# Patient Record
Sex: Male | Born: 1970 | Race: White | Hispanic: No | Marital: Married | Smoking: Current every day smoker
Health system: Southern US, Community
[De-identification: ages and names within clinical notes are randomized; demographics above are authoritative.]

## PROBLEM LIST (undated history)

## (undated) DIAGNOSIS — K449 Diaphragmatic hernia without obstruction or gangrene: Secondary | ICD-10-CM

## (undated) DIAGNOSIS — I10 Essential (primary) hypertension: Secondary | ICD-10-CM

## (undated) HISTORY — PX: LEG SURGERY: SHX1003

## (undated) HISTORY — DX: Essential (primary) hypertension: I10

## (undated) HISTORY — DX: Diaphragmatic hernia without obstruction or gangrene: K44.9

---

## 2010-01-07 ENCOUNTER — Emergency Department (HOSPITAL_COMMUNITY): Admission: EM | Admit: 2010-01-07 | Discharge: 2010-01-07 | Payer: Self-pay | Admitting: Emergency Medicine

## 2010-01-27 ENCOUNTER — Emergency Department (HOSPITAL_COMMUNITY): Admission: EM | Admit: 2010-01-27 | Discharge: 2010-01-27 | Payer: Self-pay | Admitting: Emergency Medicine

## 2010-03-22 ENCOUNTER — Emergency Department (HOSPITAL_COMMUNITY): Admission: EM | Admit: 2010-03-22 | Discharge: 2010-03-22 | Payer: Self-pay | Admitting: Emergency Medicine

## 2011-01-05 ENCOUNTER — Emergency Department (HOSPITAL_COMMUNITY)
Admission: EM | Admit: 2011-01-05 | Discharge: 2011-01-05 | Payer: Self-pay | Source: Home / Self Care | Admitting: Emergency Medicine

## 2011-01-24 ENCOUNTER — Emergency Department (HOSPITAL_COMMUNITY)
Admission: EM | Admit: 2011-01-24 | Discharge: 2011-01-24 | Disposition: A | Discharge status: Home / Self Care | Payer: Self-pay | Attending: Emergency Medicine | Admitting: Emergency Medicine

## 2011-01-24 DIAGNOSIS — K029 Dental caries, unspecified: Secondary | ICD-10-CM | POA: Insufficient documentation

## 2011-01-24 DIAGNOSIS — K089 Disorder of teeth and supporting structures, unspecified: Secondary | ICD-10-CM | POA: Insufficient documentation

## 2011-02-28 ENCOUNTER — Emergency Department (HOSPITAL_COMMUNITY)
Admission: EM | Admit: 2011-02-28 | Discharge: 2011-02-28 | Disposition: A | Discharge status: Home / Self Care | Payer: Self-pay | Attending: Emergency Medicine | Admitting: Emergency Medicine

## 2011-02-28 DIAGNOSIS — K029 Dental caries, unspecified: Secondary | ICD-10-CM | POA: Insufficient documentation

## 2011-02-28 DIAGNOSIS — K089 Disorder of teeth and supporting structures, unspecified: Secondary | ICD-10-CM | POA: Insufficient documentation

## 2011-03-05 ENCOUNTER — Emergency Department (HOSPITAL_COMMUNITY)
Admission: EM | Admit: 2011-03-05 | Discharge: 2011-03-05 | Disposition: A | Discharge status: Home / Self Care | Payer: Self-pay | Attending: Emergency Medicine | Admitting: Emergency Medicine

## 2011-03-05 DIAGNOSIS — K089 Disorder of teeth and supporting structures, unspecified: Secondary | ICD-10-CM | POA: Insufficient documentation

## 2011-03-05 DIAGNOSIS — K029 Dental caries, unspecified: Secondary | ICD-10-CM | POA: Insufficient documentation

## 2011-03-29 ENCOUNTER — Emergency Department (HOSPITAL_COMMUNITY)
Admission: EM | Admit: 2011-03-29 | Discharge: 2011-03-29 | Discharge status: Against Medical Advice | Payer: Self-pay | Attending: Emergency Medicine | Admitting: Emergency Medicine

## 2011-03-29 DIAGNOSIS — M549 Dorsalgia, unspecified: Secondary | ICD-10-CM | POA: Insufficient documentation

## 2012-05-20 ENCOUNTER — Encounter (HOSPITAL_COMMUNITY): Payer: Self-pay | Admitting: Emergency Medicine

## 2012-05-20 ENCOUNTER — Emergency Department (HOSPITAL_COMMUNITY)
Admission: EM | Admit: 2012-05-20 | Discharge: 2012-05-20 | Disposition: A | Discharge status: Home / Self Care | Payer: Self-pay | Attending: Emergency Medicine | Admitting: Emergency Medicine

## 2012-05-20 DIAGNOSIS — F17200 Nicotine dependence, unspecified, uncomplicated: Secondary | ICD-10-CM | POA: Insufficient documentation

## 2012-05-20 DIAGNOSIS — K089 Disorder of teeth and supporting structures, unspecified: Secondary | ICD-10-CM | POA: Insufficient documentation

## 2012-05-20 DIAGNOSIS — K0889 Other specified disorders of teeth and supporting structures: Secondary | ICD-10-CM

## 2012-05-20 MED ORDER — HYDROCODONE-ACETAMINOPHEN 7.5-500 MG/15ML PO SOLN: 15.0000 mL | Freq: Four times a day (QID) | ORAL | Status: AC | PRN

## 2012-05-20 NOTE — Discharge Instructions (Signed)
Donald Haynes you have been here several times over the past 2 years for the same problem.  Call the dentist today or Monday and get in to see him.  He will work with you for payment.  We can tkeep giving you narcotic pain meds for a problem that is not getting fixed.  Take the but do not drive with this medication.  Affordable dentures also pulls teeth for about 70.00.  I do not see any infection.    Dental Pain A tooth ache may be caused by cavities (tooth decay). Cavities expose the nerve of the tooth to air and hot or cold temperatures. It may come from an infection or abscess (also called a boil or furuncle) around your tooth. It is also often caused by dental caries (tooth decay). This causes the pain you are having. DIAGNOSIS  Your caregiver can diagnose this problem by exam. TREATMENT   If caused by an infection, it may be treated with medications which kill germs (antibiotics) and pain medications as prescribed by your caregiver. Take medications as directed.   Only take over-the-counter or prescription medicines for pain, discomfort, or fever as directed by your caregiver.   Whether the tooth ache today is caused by infection or dental disease, you should see your dentist as soon as possible for further care.  SEEK MEDICAL CARE IF: The exam and treatment you received today has been provided on an emergency basis only. This is not a substitute for complete medical or dental care. If your problem worsens or new problems (symptoms) appear, and you are unable to meet with your dentist, call or return to this location. SEEK IMMEDIATE MEDICAL CARE IF:   You have a fever.   You develop redness and swelling of your face, jaw, or neck.   You are unable to open your mouth.   You have severe pain uncontrolled by pain medicine.  MAKE SURE YOU:   Understand these instructions.   Will watch your condition.   Will get help right away if you are not doing well or get worse.  Document Released:  11/29/2005 Document Revised: 11/18/2011 Document Reviewed: 07/17/2008 [facility] Patient Information 2012 [place], [place].

## 2012-05-20 NOTE — ED Notes (Signed)
Pt. Stated, My little boy came back on me and broke my front tooth which was atready bad.

## 2012-05-20 NOTE — ED Provider Notes (Signed)
History     CSN: [id number]  Arrival date & time 05/20/12  4098   First MD Initiated Contact with Patient 05/20/12 [phone]      Chief Complaint  Patient presents with  . Dental Pain    (Consider location/radiation/quality/duration/timing/severity/associated sxs/prior treatment) Patient is a 41 y.o. male presenting with tooth pain. The history is provided by the patient. No language interpreter was used.  Dental PainThe primary symptoms include mouth pain. Primary symptoms do not include fever or sore throat. The symptoms are worsening. The symptoms occur constantly.  Additional symptoms include: dental sensitivity to temperature and gum tenderness. Additional symptoms do not include: gum swelling, purulent gums and ear pain. Medical issues include: smoking.  Patient her for tooth pain that he has had for 4 years according to records.  States that his son leaned back and cracked his front tooth.  Upper teeth with about 10 decayed teeth.  Patient has gotten one tooth pulled in the last 3 years that he has been coming here for pain meds.  No fracture seen in the tooth he is complaining about.  No gum swelling.  States that he used a hot needle this am to drain infection.  No area noted of infection.    History reviewed. No pertinent past medical history.  History reviewed. No pertinent past surgical history.  No family history on file.  History  Substance Use Topics  . Smoking status: Current Everyday Smoker    Types: Cigarettes  . Smokeless tobacco: Not on file  . Alcohol Use: No      Review of Systems  Constitutional: Negative.  Negative for fever.  HENT: Negative.  Negative for ear pain and sore throat.   Eyes: Negative.   Respiratory: Negative.   Cardiovascular: Negative.   Gastrointestinal: Negative.  Negative for nausea and vomiting.  Neurological: Negative.   Psychiatric/Behavioral: Negative.   All other systems reviewed and are negative.    Allergies  Penicillins  and Tramadol  Home Medications   Current Outpatient Rx  Name Route Sig Dispense Refill  . ANBESOL MT Mouth/Throat Use as directed 1 application in the mouth or throat as needed. For tooth pain    . IBUPROFEN 600 MG PO TABS Oral Take 1,200 mg by mouth every 6 (six) hours as needed. For pain      BP 141/72  Pulse 87  Temp(Src) 98.2 F (36.8 C) (Oral)  Resp 20  SpO2 97%  Physical Exam  Nursing note and vitals reviewed. Constitutional: He is oriented to person, place, and time. He appears well-developed and well-nourished.  HENT:  Head: Normocephalic.  Eyes: Conjunctivae and EOM are normal. Pupils are equal, round, and reactive to light.  Neck: Normal range of motion. Neck supple.  Cardiovascular: Normal rate.   Pulmonary/Chest: Effort normal.  Abdominal: Soft.  Musculoskeletal: Normal range of motion.  Neurological: He is alert and oriented to person, place, and time.  Skin: Skin is warm and dry.  Psychiatric: He has a normal mood and affect.    ED Course  Procedures (including critical care time)  Labs Reviewed - No data to display No results found.   No diagnosis found.    MDM  C/o fractured tooth and dental pain.  No fracture seen.  Suspect drug seeker.  Has been in the system 7 times in the past 2 years for pain meds.  Will give lortab elixir rx for pain.  No infection seen.        Remi Haggard, NP  05/21/12 1206  Patient not in room on discharge  Remi Haggard, NP 05/21/12 1209

## 2012-05-21 NOTE — ED Provider Notes (Signed)
Medical screening examination/treatment/procedure(s) were performed by non-physician practitioner and as supervising physician I was immediately available for consultation/collaboration.   Nat Christen, MD 05/21/12 [phone]

## 2014-05-29 ENCOUNTER — Encounter (HOSPITAL_COMMUNITY): Payer: Self-pay | Admitting: Emergency Medicine

## 2014-05-29 ENCOUNTER — Emergency Department (HOSPITAL_COMMUNITY): Payer: Self-pay

## 2014-05-29 ENCOUNTER — Emergency Department (HOSPITAL_COMMUNITY)
Admission: EM | Admit: 2014-05-29 | Discharge: 2014-05-29 | Disposition: A | Discharge status: Home / Self Care | Payer: Self-pay | Attending: Emergency Medicine | Admitting: Emergency Medicine

## 2014-05-29 DIAGNOSIS — S3981XA Other specified injuries of abdomen, initial encounter: Secondary | ICD-10-CM | POA: Insufficient documentation

## 2014-05-29 DIAGNOSIS — Y9289 Other specified places as the place of occurrence of the external cause: Secondary | ICD-10-CM | POA: Insufficient documentation

## 2014-05-29 DIAGNOSIS — S61209A Unspecified open wound of unspecified finger without damage to nail, initial encounter: Secondary | ICD-10-CM | POA: Insufficient documentation

## 2014-05-29 DIAGNOSIS — Y9389 Activity, other specified: Secondary | ICD-10-CM | POA: Insufficient documentation

## 2014-05-29 DIAGNOSIS — S6990XA Unspecified injury of unspecified wrist, hand and finger(s), initial encounter: Secondary | ICD-10-CM

## 2014-05-29 DIAGNOSIS — S0990XA Unspecified injury of head, initial encounter: Secondary | ICD-10-CM

## 2014-05-29 DIAGNOSIS — Z880 Allergy status to penicillin: Secondary | ICD-10-CM | POA: Insufficient documentation

## 2014-05-29 DIAGNOSIS — S39012A Strain of muscle, fascia and tendon of lower back, initial encounter: Secondary | ICD-10-CM

## 2014-05-29 DIAGNOSIS — F1410 Cocaine abuse, uncomplicated: Secondary | ICD-10-CM | POA: Insufficient documentation

## 2014-05-29 DIAGNOSIS — S060X9A Concussion with loss of consciousness of unspecified duration, initial encounter: Secondary | ICD-10-CM | POA: Insufficient documentation

## 2014-05-29 DIAGNOSIS — W19XXXA Unspecified fall, initial encounter: Secondary | ICD-10-CM

## 2014-05-29 DIAGNOSIS — S61219A Laceration without foreign body of unspecified finger without damage to nail, initial encounter: Secondary | ICD-10-CM

## 2014-05-29 DIAGNOSIS — W1809XA Striking against other object with subsequent fall, initial encounter: Secondary | ICD-10-CM | POA: Insufficient documentation

## 2014-05-29 DIAGNOSIS — F17200 Nicotine dependence, unspecified, uncomplicated: Secondary | ICD-10-CM | POA: Insufficient documentation

## 2014-05-29 DIAGNOSIS — S335XXA Sprain of ligaments of lumbar spine, initial encounter: Secondary | ICD-10-CM | POA: Insufficient documentation

## 2014-05-29 LAB — BASIC METABOLIC PANEL
BUN: 14 mg/dL (ref 6–23)
CHLORIDE: 102 meq/L (ref 96–112)
CO2: 26 mEq/L (ref 19–32)
CREATININE: 0.71 mg/dL (ref 0.50–1.35)
Calcium: 9.1 mg/dL (ref 8.4–10.5)
Glucose, Bld: 110 mg/dL — ABNORMAL HIGH (ref 70–99)
POTASSIUM: 3.5 meq/L — AB (ref 3.7–5.3)
Sodium: 141 mEq/L (ref 137–147)

## 2014-05-29 LAB — CBC WITH DIFFERENTIAL/PLATELET
BASOS ABS: 0 10*3/uL (ref 0.0–0.1)
BASOS PCT: 0 % (ref 0–1)
EOS ABS: 0.3 10*3/uL (ref 0.0–0.7)
Eosinophils Relative: 4 % (ref 0–5)
HCT: 34.4 % — ABNORMAL LOW (ref 39.0–52.0)
Hemoglobin: 11.7 g/dL — ABNORMAL LOW (ref 13.0–17.0)
Lymphocytes Relative: 42 % (ref 12–46)
Lymphs Abs: 3.5 10*3/uL (ref 0.7–4.0)
MCH: 29.3 pg (ref 26.0–34.0)
MCHC: 34 g/dL (ref 30.0–36.0)
MCV: 86 fL (ref 78.0–100.0)
MONO ABS: 0.8 10*3/uL (ref 0.1–1.0)
MONOS PCT: 9 % (ref 3–12)
NEUTROS ABS: 3.7 10*3/uL (ref 1.7–7.7)
NEUTROS PCT: 45 % (ref 43–77)
Platelets: 351 10*3/uL (ref 150–400)
RBC: 4 MIL/uL — ABNORMAL LOW (ref 4.22–5.81)
RDW: 14.3 % (ref 11.5–15.5)
WBC: 8.3 10*3/uL (ref 4.0–10.5)

## 2014-05-29 MED ORDER — HYDROMORPHONE HCL PF 1 MG/ML IJ SOLN
1.0000 mg | Freq: Once | INTRAMUSCULAR | Status: AC
  Administered 2014-05-29: 1 mg via INTRAVENOUS
  Filled 2014-05-29: qty 1

## 2014-05-29 MED ORDER — IOHEXOL 300 MG/ML  SOLN
100.0000 mL | Freq: Once | INTRAMUSCULAR | Status: AC | PRN
  Administered 2014-05-29: 100 mL via INTRAVENOUS

## 2014-05-29 MED ORDER — SODIUM CHLORIDE 0.9 % IV BOLUS (SEPSIS)
250.0000 mL | Freq: Once | INTRAVENOUS | Status: AC
  Administered 2014-05-29: 250 mL via INTRAVENOUS

## 2014-05-29 MED ORDER — LIDOCAINE HCL (PF) 2 % IJ SOLN
INTRAMUSCULAR | Status: AC
  Filled 2014-05-29: qty 10

## 2014-05-29 MED ORDER — HYDROCODONE-ACETAMINOPHEN 5-325 MG PO TABS: 2.0000 | ORAL_TABLET | ORAL | Status: DC | PRN

## 2014-05-29 MED ORDER — POVIDONE-IODINE 10 % EX SOLN
CUTANEOUS | Status: AC
  Filled 2014-05-29: qty 118

## 2014-05-29 MED ORDER — SODIUM CHLORIDE 0.9 % IV SOLN
INTRAVENOUS | Status: DC
  Administered 2014-05-29: 1000 mL via INTRAVENOUS

## 2014-05-29 MED ORDER — NAPROXEN 500 MG PO TABS: 500.0000 mg | ORAL_TABLET | Freq: Two times a day (BID) | ORAL | Status: DC

## 2014-05-29 MED ORDER — ONDANSETRON HCL 4 MG/2ML IJ SOLN
4.0000 mg | Freq: Once | INTRAMUSCULAR | Status: AC
  Administered 2014-05-29: 4 mg via INTRAVENOUS
  Filled 2014-05-29: qty 2

## 2014-05-29 MED ORDER — BACITRACIN-NEOMYCIN-POLYMYXIN 400-5-5000 EX OINT
TOPICAL_OINTMENT | CUTANEOUS | Status: AC
  Filled 2014-05-29: qty 1

## 2014-05-29 MED ORDER — HYDROCODONE-ACETAMINOPHEN 5-325 MG PO TABS: 1.0000 | ORAL_TABLET | Freq: Four times a day (QID) | ORAL | Status: DC | PRN

## 2014-05-29 MED ORDER — KETOROLAC TROMETHAMINE 30 MG/ML IJ SOLN
30.0000 mg | Freq: Once | INTRAMUSCULAR | Status: AC
  Administered 2014-05-29: 30 mg via INTRAVENOUS
  Filled 2014-05-29: qty 1

## 2014-05-29 NOTE — ED Provider Notes (Signed)
LACERATION REPAIR Performed by: Burgess AmorIDOL, JULIE Authorized by: Burgess AmorIDOL, JULIE Consent: Verbal consent obtained. Risks and benefits: risks, benefits and alternatives were discussed Consent given by: patient Patient identity confirmed: provided demographic data Prepped and Draped in normal sterile fashion Wound explored  Laceration Location: left index finger  Laceration Length: 2.5 cm x 0.5 cmcm  No Foreign Bodies seen or palpated  Anesthesia: digital block  Local anesthetic: lidocaine2% without epinephrine  Anesthetic total: 1.5 ml  Irrigation method: syringe Amount of cleaning: standard  Skin closure: ethilon 4-0  Number of sutures: 8  Technique: simple interrupted.  Patient tolerance: Patient tolerated the procedure well with no immediate complications.   NERVE BLOCK Performed by: Burgess AmorIDOL, JULIE Consent: Verbal consent obtained. Required items: required blood products, implants, devices, and special equipment available Time out: Immediately prior to procedure a "time out" was called to verify the correct patient, procedure, equipment, support staff and site/side marked as required.  Indication: laceration Nerve block body site: left index finger  Preparation: Patient was prepped and draped in the usual sterile fashion. Needle gauge: 25G Location technique: anatomical landmarks  Local anesthetic: lidocaine 2% without epi  Anesthetic total: 1.5 ml  Outcome: pain improved Patient tolerance: Patient tolerated the procedure well with no immediate complications.   Burgess AmorJulie Idol, PA-C 05/29/14 2306

## 2014-05-29 NOTE — ED Notes (Signed)
Placed hand to soak in order to remove gauze.

## 2014-05-29 NOTE — ED Provider Notes (Addendum)
CSN: [id number]     Arrival date & time 05/29/14  1843 History   First MD Initiated Contact with Patient 05/29/14 1954     Chief Complaint  Patient presents with  . Fall     (Consider location/radiation/quality/duration/timing/severity/associated sxs/prior Treatment) Patient is a 43 y.o. male presenting with fall. The history is provided by the patient.  Fall Associated symptoms include abdominal pain and headaches. Pertinent negatives include no chest pain and no shortness of breath.   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 Patient has no recollection of the fall apparently did have loss of consciousness. Patient with complaint of left index finger laceration and pain in the lumbar area and questionable pain to the abdomen. Patient was not brought in by EMS. Brought in by family members. Patient is complaining of headache is alert and oriented. Denies any neck pain. Patient not in a c-collar on spineboard. Patient states his tetanus is up-to-date.      History reviewed. No pertinent past medical history. History reviewed. No pertinent past surgical history. No family history on file. History  Substance Use Topics  . Smoking status: Current Every Day Smoker    Types: Cigarettes  . Smokeless tobacco: Not on file  . Alcohol Use: No    Review of Systems  Constitutional: Negative for fever.  HENT: Negative for congestion.   Eyes: Negative for visual disturbance.  Respiratory: Negative for shortness of breath.   Cardiovascular: Negative for chest pain.  Gastrointestinal: Positive for abdominal pain. Negative for nausea and vomiting.  Genitourinary: Negative for hematuria.  Musculoskeletal: Positive for back pain. Negative for neck pain.  Skin: Positive for wound.  Neurological: Positive for headaches. Negative for dizziness, speech difficulty, weakness and numbness.  Hematological: Does not bruise/bleed easily.  Psychiatric/Behavioral:  Negative for confusion.      Allergies  Penicillins and Tramadol  Home Medications   Prior to Admission medications   Medication Sig Start Date End Date Taking? Authorizing Provider  ibuprofen (ADVIL,MOTRIN) 600 MG tablet Take 1,200 mg by mouth every 6 (six) hours as needed. For pain   Yes Historical Provider, MD   BP 145/89  Pulse 106  Temp(Src) 97.9 F (36.6 C) (Oral)  Resp 20  Ht 6' (1.829 m)  Wt 170 lb (77.111 kg)  BMI 23.05 kg/m2  SpO2 97% Physical Exam  Nursing note and vitals reviewed. Constitutional: He is oriented to person, place, and time. He appears well-developed and well-nourished. No distress.  HENT:  Head: Normocephalic and atraumatic.  Mouth/Throat: Oropharynx is clear and moist.  Eyes: Conjunctivae and EOM are normal. Pupils are equal, round, and reactive to light.  Neck: Normal range of motion. Neck supple.  Cardiovascular: Normal rate, regular rhythm and normal heart sounds.   No murmur heard. Pulmonary/Chest: Effort normal and breath sounds normal. No respiratory distress.  Abdominal: Soft. Bowel sounds are normal. There is no tenderness.  Musculoskeletal: Normal range of motion.  With the exception of left index finger. Laceration to that area. Index finger distal cap refill is normal. radial pulse in the left hand is 2+. Questionable decreased sensation to the left index finger. No other fingers injured. Patient is right-hand dominant. Laceration to the left index finger saw the palmar surface. Measures about 2.5 cm x 0.5 cm. Morbid avulsion of the skin will require suturing. Does not involve deep structures. No tendon involvement.  Neurological: He is alert and oriented to person, place, and time. No cranial nerve deficit. He exhibits  normal muscle tone. Coordination normal.  Skin: Skin is warm.    ED Course  Procedures (including critical care time) Labs Review Labs Reviewed  CBC WITH DIFFERENTIAL - Abnormal; Notable for the following:    RBC  4.00 (*)    Hemoglobin 11.7 (*)    HCT 34.4 (*)    All other components within normal limits  BASIC METABOLIC PANEL - Abnormal; Notable for the following:    Potassium 3.5 (*)    Glucose, Bld 110 (*)    All other components within normal limits   Results for orders placed during the hospital encounter of 05/29/14  CBC WITH DIFFERENTIAL      Result Value Ref Range   WBC 8.3  4.0 - 10.5 K/uL   RBC 4.00 (*) 4.22 - 5.81 MIL/uL   Hemoglobin 11.7 (*) 13.0 - 17.0 g/dL   HCT 40.9 (*) 81.1 - 91.4 %   MCV 86.0  78.0 - 100.0 fL   MCH 29.3  26.0 - 34.0 pg   MCHC 34.0  30.0 - 36.0 g/dL   RDW 78.2  95.6 - 21.3 %   Platelets 351  150 - 400 K/uL   Neutrophils Relative % 45  43 - 77 %   Neutro Abs 3.7  1.7 - 7.7 K/uL   Lymphocytes Relative 42  12 - 46 %   Lymphs Abs 3.5  0.7 - 4.0 K/uL   Monocytes Relative 9  3 - 12 %   Monocytes Absolute 0.8  0.1 - 1.0 K/uL   Eosinophils Relative 4  0 - 5 %   Eosinophils Absolute 0.3  0.0 - 0.7 K/uL   Basophils Relative 0  0 - 1 %   Basophils Absolute 0.0  0.0 - 0.1 K/uL  BASIC METABOLIC PANEL      Result Value Ref Range   Sodium 141  137 - 147 mEq/L   Potassium 3.5 (*) 3.7 - 5.3 mEq/L   Chloride 102  96 - 112 mEq/L   CO2 26  19 - 32 mEq/L   Glucose, Bld 110 (*) 70 - 99 mg/dL   BUN 14  6 - 23 mg/dL   Creatinine, Ser 0.86  0.50 - 1.35 mg/dL   Calcium 9.1  8.4 - 57.8 mg/dL   GFR calc non Af Amer >90  >90 mL/min   GFR calc Af Amer >90  >90 mL/min     Imaging Review Ct Head Wo Contrast  05/29/2014   CLINICAL DATA:  Patient fell one story through floor. Headache. Concern for cervical spine injury.  EXAM: CT HEAD WITHOUT CONTRAST  CT CERVICAL SPINE WITHOUT CONTRAST  TECHNIQUE: Multidetector CT imaging of the head and cervical spine was performed following the standard protocol without intravenous contrast. Multiplanar CT image reconstructions of the cervical spine were also generated.  COMPARISON:  None.  FINDINGS: CT HEAD FINDINGS  There is no evidence of  acute infarction, mass lesion, or intra- or extra-axial hemorrhage on CT.  The posterior fossa, including the cerebellum, brainstem and fourth ventricle, is within normal limits. The third and lateral ventricles, and basal ganglia are unremarkable in appearance. The cerebral hemispheres are symmetric in appearance, with normal gray-white differentiation. No mass effect or midline shift is seen.  There is no evidence of fracture; visualized osseous structures are unremarkable in appearance. The visualized portions of the orbits are within normal limits. The paranasal sinuses and mastoid air cells are well-aerated. No significant soft tissue abnormalities are seen.  CT CERVICAL SPINE FINDINGS  There is no evidence of fracture or subluxation. Vertebral bodies demonstrate normal height and alignment. Intervertebral disc spaces are preserved. Prevertebral soft tissues are within normal limits. The visualized neural foramina are grossly unremarkable.  The thyroid gland is unremarkable in appearance. Minimal scarring is noted at the lung apices. No significant soft tissue abnormalities are seen.  IMPRESSION: 1. No evidence of traumatic intracranial injury or fracture. 2. No evidence of fracture or subluxation along the cervical spine.   Electronically Signed   By: Roanna RaiderJeffery  Chang M.D.   On: 05/29/2014 21:20   Ct Cervical Spine Wo Contrast  05/29/2014   CLINICAL DATA:  Patient fell one story through floor. Headache. Concern for cervical spine injury.  EXAM: CT HEAD WITHOUT CONTRAST  CT CERVICAL SPINE WITHOUT CONTRAST  TECHNIQUE: Multidetector CT imaging of the head and cervical spine was performed following the standard protocol without intravenous contrast. Multiplanar CT image reconstructions of the cervical spine were also generated.  COMPARISON:  None.  FINDINGS: CT HEAD FINDINGS  There is no evidence of acute infarction, mass lesion, or intra- or extra-axial hemorrhage on CT.  The posterior fossa, including the  cerebellum, brainstem and fourth ventricle, is within normal limits. The third and lateral ventricles, and basal ganglia are unremarkable in appearance. The cerebral hemispheres are symmetric in appearance, with normal gray-white differentiation. No mass effect or midline shift is seen.  There is no evidence of fracture; visualized osseous structures are unremarkable in appearance. The visualized portions of the orbits are within normal limits. The paranasal sinuses and mastoid air cells are well-aerated. No significant soft tissue abnormalities are seen.  CT CERVICAL SPINE FINDINGS  There is no evidence of fracture or subluxation. Vertebral bodies demonstrate normal height and alignment. Intervertebral disc spaces are preserved. Prevertebral soft tissues are within normal limits. The visualized neural foramina are grossly unremarkable.  The thyroid gland is unremarkable in appearance. Minimal scarring is noted at the lung apices. No significant soft tissue abnormalities are seen.  IMPRESSION: 1. No evidence of traumatic intracranial injury or fracture. 2. No evidence of fracture or subluxation along the cervical spine.   Electronically Signed   By: Roanna RaiderJeffery  Chang M.D.   On: 05/29/2014 21:20   Ct Abdomen Pelvis W Contrast  05/29/2014   CLINICAL DATA:  Status post fall with left flank pain  EXAM: CT ABDOMEN AND PELVIS WITH CONTRAST  TECHNIQUE: Multidetector CT imaging of the abdomen and pelvis was performed using the standard protocol following bolus administration of intravenous contrast.  CONTRAST:  100mL OMNIPAQUE IOHEXOL 300 MG/ML  SOLN  COMPARISON:  None.  FINDINGS: There is focal fatty infiltration of liver near the falciform ligament. The liver is otherwise normal. The spleen, pancreas, gallbladder, adrenal glands and kidneys are normal. The aorta is normal in caliber. There is mild atherosclerosis of the right iliac artery. There is no abdominal lymphadenopathy. There is no free air. There is no small  bowel obstruction or diverticulitis. The appendix is not seen but no inflammation is noted around the cecum to suggest appendicitis.  Fluid-filled bladder is normal. There is no pelvic lymphadenopathy. There is minimal dependent atelectasis of the posterior left lung base. There is no pleural effusion. No acute abnormalities identified within the visualized bones.  IMPRESSION: No acute posttraumatic change identified in the abdomen and pelvis. The kidneys are normal without hydronephrosis bilaterally.   Electronically Signed   By: Sherian ReinWei-Chen  Lin M.D.   On: 05/29/2014 21:27   Dg Hand Complete Left  05/29/2014  CLINICAL DATA:  Status post fall with laceration to the left index finger.  EXAM: LEFT HAND - COMPLETE 3+ VIEW  COMPARISON:  None.  FINDINGS: There is no evidence of fracture or dislocation. There is soft tissue swelling with the gauze in the second digit.  IMPRESSION: No acute fracture or dislocation.Soft tissue swelling with the gauze in the second digit.   Electronically Signed   By: Sherian Rein M.D.   On: 05/29/2014 21:40     EKG Interpretation None      MDM   Final diagnoses:  Fall  Head injury  Lumbar strain  Finger injury     patient status post fall at construction site. With loss of consciousness. Injury to left index finger. Patient with right hand dominance. CT scan of head neck abdomen pelvis negative. X-rays of the left hand negative for any bony fractures. Patient's tetanus is up-to-date. Patient clearly has a concussion however is alert 9 and. No nausea no vomiting. Patient can be discharged home. Patient left index finger laceration it is superficial not involving any deep tendon structures. That's more of an avulsion measuring about the 3.5 cm in length. Well require suturing. We'll have him mid-level do the suturing. Patient then can be discharged. Discharge up a work will be completed.   Vanetta Mulders, MD 05/29/14 [id number]  Vanetta Mulders, MD 05/29/14 2207

## 2014-05-29 NOTE — Discharge Instructions (Signed)
Expect to be sore the next 2 days and then slowly improving. He had a significant fall. Take the Naprosyn and pain medicine as needed. Also probably had a concussion so would avoid being on high places for the next 7 days. Work note will be provided for one week. For the finger laceration suture removal bear and 7-10 days. Return for any evidence of wound infection. As you stated you up-to-date on her tetanus.

## 2014-05-29 NOTE — ED Notes (Signed)
Pt reports falling x 2 stories today, hitting head with + LOC.  C/o pain to left flank and right index finger with laceration.  C/o headache.  Alert and oriented x 4.  Denies neck pain.

## 2014-05-29 NOTE — ED Notes (Signed)
Pt alert & oriented x4, stable gait. Patient given discharge instructions, paperwork & prescription(s). Patient  instructed to stop at the registration desk to finish any additional paperwork. Patient verbalized understanding. Pt left department w/ no further questions. 

## 2014-05-30 NOTE — ED Provider Notes (Signed)
Medical screening examination/treatment/procedure(s) were conducted as a shared visit with non-physician practitioner(s) and myself.  I personally evaluated the patient during the encounter.   EKG Interpretation None     See my note  Vanetta MuldersScott Zackowski, MD 05/30/14 1036

## 2014-06-17 MED FILL — Hydrocodone-Acetaminophen Tab 5-325 MG: ORAL | Qty: 6 | Status: AC

## 2015-07-31 ENCOUNTER — Emergency Department (HOSPITAL_COMMUNITY)
Admission: EM | Admit: 2015-07-31 | Discharge: 2015-07-31 | Disposition: A | Discharge status: Home / Self Care | Payer: Self-pay | Attending: Physician Assistant | Admitting: Physician Assistant

## 2015-07-31 ENCOUNTER — Encounter (HOSPITAL_COMMUNITY): Payer: Self-pay

## 2015-07-31 DIAGNOSIS — S025XXA Fracture of tooth (traumatic), initial encounter for closed fracture: Secondary | ICD-10-CM | POA: Insufficient documentation

## 2015-07-31 DIAGNOSIS — X58XXXA Exposure to other specified factors, initial encounter: Secondary | ICD-10-CM | POA: Insufficient documentation

## 2015-07-31 DIAGNOSIS — R51 Headache: Secondary | ICD-10-CM | POA: Insufficient documentation

## 2015-07-31 DIAGNOSIS — K029 Dental caries, unspecified: Secondary | ICD-10-CM | POA: Insufficient documentation

## 2015-07-31 DIAGNOSIS — Y9289 Other specified places as the place of occurrence of the external cause: Secondary | ICD-10-CM | POA: Insufficient documentation

## 2015-07-31 DIAGNOSIS — Y9389 Activity, other specified: Secondary | ICD-10-CM | POA: Insufficient documentation

## 2015-07-31 DIAGNOSIS — Z720 Tobacco use: Secondary | ICD-10-CM | POA: Insufficient documentation

## 2015-07-31 DIAGNOSIS — Z880 Allergy status to penicillin: Secondary | ICD-10-CM | POA: Insufficient documentation

## 2015-07-31 DIAGNOSIS — K047 Periapical abscess without sinus: Secondary | ICD-10-CM | POA: Insufficient documentation

## 2015-07-31 DIAGNOSIS — R590 Localized enlarged lymph nodes: Secondary | ICD-10-CM | POA: Insufficient documentation

## 2015-07-31 DIAGNOSIS — Y998 Other external cause status: Secondary | ICD-10-CM | POA: Insufficient documentation

## 2015-07-31 MED ORDER — CLINDAMYCIN HCL 150 MG PO CAPS: 300.0000 mg | ORAL_CAPSULE | Freq: Three times a day (TID) | ORAL | Status: DC

## 2015-07-31 MED ORDER — CLINDAMYCIN HCL 150 MG PO CAPS
300.0000 mg | ORAL_CAPSULE | Freq: Once | ORAL | Status: AC
  Administered 2015-07-31: 300 mg via ORAL
  Filled 2015-07-31: qty 2

## 2015-07-31 MED ORDER — HYDROCODONE-ACETAMINOPHEN 5-325 MG PO TABS: 2.0000 | ORAL_TABLET | ORAL | Status: DC | PRN

## 2015-07-31 NOTE — ED Provider Notes (Signed)
CSN: [id number]     Arrival date & time 07/31/15  2124 History   First MD Initiated Contact with Patient 07/31/15 2138     Chief Complaint  Patient presents with  . Dental Pain     (Consider location/radiation/quality/duration/timing/severity/associated sxs/prior Treatment) Patient is a 44 y.o. male presenting with tooth pain. The history is provided by the patient.  Dental Pain Location:  Lower Lower teeth location:  31/RL 2nd molar Quality:  Throbbing and constant Severity:  Severe Onset quality:  Gradual Duration:  1 week Progression:  Worsening Chronicity:  New Context: dental caries and dental fracture   Relieved by:  Nothing Worsened by:  Cold food/drink and pressure Ineffective treatments:  NSAIDs Associated symptoms: facial pain, facial swelling, gum swelling and headaches   Associated symptoms: no trismus   Risk factors: lack of dental care and smoking    Donald Haynes is a 44 y.o. male who presents to the ED with dental pain. He states that while eating last week he bit down on something causing his right lower molar to break. Since the the tooth has been hurting and has had drainage from the gum. He complains of gland swelling, headache and chills. He has been taking ibuprofen for pain without relief. He states that today at work he had a lot of swelling and then something popped on his gum and drainage came out of it.   History reviewed. No pertinent past medical history. History reviewed. No pertinent past surgical history. History reviewed. No pertinent family history. Social History  Substance Use Topics  . Smoking status: Current Every Day Smoker -- 1.00 packs/day    Types: Cigarettes  . Smokeless tobacco: None  . Alcohol Use: No    Review of Systems  HENT: Positive for dental problem and facial swelling.   Neurological: Positive for headaches.  all other systems negative    Allergies  Penicillins and Tramadol  Home Medications   Prior to  Admission medications   Medication Sig Start Date End Date Taking? Authorizing Provider  clindamycin (CLEOCIN) 150 MG capsule Take 2 capsules (300 mg total) by mouth 3 (three) times daily. 07/31/15   Amiylah Anastos Orlene Och, NP  HYDROcodone-acetaminophen (NORCO/VICODIN) 5-325 MG per tablet Take 2 tablets by mouth every 4 (four) hours as needed. 07/31/15   Kamya Watling Orlene Och, NP   BP 146/81 mmHg  Pulse 99  Temp(Src) 99.4 F (37.4 C) (Oral)  Resp 18  Ht 6\' 1"  (1.854 m)  Wt 150 lb (68.04 kg)  BMI 19.79 kg/m2  SpO2 100% Physical Exam  Constitutional: He is oriented to person, place, and time. He appears well-developed and well-nourished. No distress.  HENT:  Head: Normocephalic.  Mouth/Throat: Uvula is midline, oropharynx is clear and moist and mucous membranes are normal.    Multiple dental caries and broken teeth. Gum surrounding the second molar tender with erythema and swelling. There is a small amount of drainage.   Eyes: EOM are normal.  Neck: Neck supple.  Cardiovascular: Normal rate.   Pulmonary/Chest: Effort normal.  Musculoskeletal: Normal range of motion.  Lymphadenopathy:    He has cervical adenopathy.  Neurological: He is alert and oriented to person, place, and time. No cranial nerve deficit.  Skin: Skin is warm and dry.  Psychiatric: He has a normal mood and affect. His behavior is normal.  Nursing note and vitals reviewed.   ED Course  Procedures   MDM  44 y.o. male with dental pain, broken tooth and mild facial swelling.  Stable for d/c without fever and does not appear toxic. Will treat with antibiotics and pain medication and he will follow up with a dentist as soon as possible. First dose of Clindamycin given prior to d/c and pre pack of hydrocodone. He will take ibuprofen for pain.   Final diagnoses:  Dental abscess  Broken tooth, closed, initial encounter       [facility], NP 08/01/15 0144  Courteney Randall An, MD 08/05/15 1451

## 2015-07-31 NOTE — ED Notes (Signed)
Patient states he had a dental abscess that busted today. Patient states fluid went down his throat , and is now having difficulty swallowing.

## 2015-07-31 NOTE — Discharge Instructions (Signed)
Dental Abscess A dental abscess is a collection of infected fluid (pus) from a bacterial infection in the inner part of the tooth (pulp). It usually occurs at the end of the tooth's root.  CAUSES   Severe tooth decay.  Trauma to the tooth that allows bacteria to enter into the pulp, such as a broken or chipped tooth. SYMPTOMS   Severe pain in and around the infected tooth.  Swelling and redness around the abscessed tooth or in the mouth or face.  Tenderness.  Pus drainage.  Bad breath.  Bitter taste in the mouth.  Difficulty swallowing.  Difficulty opening the mouth.  Nausea.  Vomiting.  Chills.  Swollen neck glands. DIAGNOSIS   A medical and dental history will be taken.  An examination will be performed by tapping on the abscessed tooth.  X-rays may be taken of the tooth to identify the abscess. TREATMENT The goal of treatment is to eliminate the infection. You may be prescribed antibiotic medicine to stop the infection from spreading. A root canal may be performed to save the tooth. If the tooth cannot be saved, it may be pulled (extracted) and the abscess may be drained.  HOME CARE INSTRUCTIONS  Only take over-the-counter or prescription medicines for pain, fever, or discomfort as directed by your caregiver.  Rinse your mouth (gargle) often with salt water ( tsp salt in 8 oz [250 ml] of warm water) to relieve pain or swelling.  Do not drive after taking pain medicine (narcotics).  Do not apply heat to the outside of your face.  Return to your dentist for further treatment as directed. SEEK MEDICAL CARE IF:  Your pain is not helped by medicine.  Your pain is getting worse instead of better. SEEK IMMEDIATE MEDICAL CARE IF:  You have a fever or persistent symptoms for more than 2-3 days.  You have a fever and your symptoms suddenly get worse.  You have chills or a very bad headache.  You have problems breathing or swallowing.  You have trouble  opening your mouth.  You have swelling in the neck or around the eye. Document Released: 11/29/2005 Document Revised: 08/23/2012 Document Reviewed: 03/09/2011 [facility] Patient Information 2015 [place], [place]. This information is not intended to replace advice given to you by your health care provider. Make sure you discuss any questions you have with your health care provider.  Dental Fracture You have a dental fracture or injury. This can mean the tooth is loose, has a chip in the enamel or is broken. If just the outer enamel is chipped, there is a good chance the tooth will not become infected. The only treatment needed may be to smooth off a rough edge. Fractures into the deeper layers (dentin and pulp) cause greater pain and are more likely to become infected. These require you to see a dentist as soon as possible to save the tooth. Loose teeth may need to be wired or bonded with a plastic splint to hold them in place. A paste may be painted on the open area of the broken tooth to reduce the pain. Antibiotics and pain medicine may be prescribed. Choosing a soft or liquid diet and rinsing the mouth out with warm water after meals may be helpful. See your dentist as recommended. Failure to seek care or follow up with a dentist or other specialist as recommended could result in the loss of your tooth, infection, or permanent dental problems. SEEK MEDICAL CARE IF:   You have increased pain  not controlled with medicines.  You have swelling around the tooth, in the face or neck.  You have bleeding which starts, continues, or gets worse.  You have a fever. Document Released: 01/06/2005 Document Revised: 02/21/2012 Document Reviewed: 10/21/2009 [facility] Patient Information 2015 [place], [place]. This information is not intended to replace advice given to you by your health care provider. Make sure you discuss any questions you have with your health care provider.  Dental Care and Dentist  Visits Dental care supports good overall health. Regular dental visits can also help you avoid dental pain, bleeding, infection, and other more serious health problems in the future. It is important to keep the mouth healthy because diseases in the teeth, gums, and other oral tissues can spread to other areas of the body. Some problems, such as diabetes, heart disease, and pre-term labor have been associated with poor oral health.  See your dentist every 6 months. If you experience emergency problems such as a toothache or broken tooth, go to the dentist right away. If you see your dentist regularly, you may catch problems early. It is easier to be treated for problems in the early stages.  WHAT TO EXPECT AT A DENTIST VISIT  Your dentist will look for many common oral health problems and recommend proper treatment. At your regular dental visit, you can expect:  Gentle cleaning of the teeth and gums. This includes scraping and polishing. This helps to remove the sticky substance around the teeth and gums (plaque). Plaque forms in the mouth shortly after eating. Over time, plaque hardens on the teeth as tartar. If tartar is not removed regularly, it can cause problems. Cleaning also helps remove stains.  Periodic X-rays. These pictures of the teeth and supporting bone will help your dentist assess the health of your teeth.  Periodic fluoride treatments. Fluoride is a natural mineral shown to help strengthen teeth. Fluoride treatmentinvolves applying a fluoride gel or varnish to the teeth. It is most commonly done in children.  Examination of the mouth, tongue, jaws, teeth, and gums to look for any oral health problems, such as:  Cavities (dental caries). This is decay on the tooth caused by plaque, sugar, and acid in the mouth. It is best to catch a cavity when it is small.  Inflammation of the gums caused by plaque buildup (gingivitis).  Problems with the mouth or malformed or misaligned  teeth.  Oral cancer or other diseases of the soft tissues or jaws. KEEP YOUR TEETH AND GUMS HEALTHY For healthy teeth and gums, follow these general guidelines as well as your dentist's specific advice:  Have your teeth professionally cleaned at the dentist every 6 months.  Brush twice daily with a fluoride toothpaste.  Floss your teeth daily.  Ask your dentist if you need fluoride supplements, treatments, or fluoride toothpaste.  Eat a healthy diet. Reduce foods and drinks with added sugar.  Avoid smoking. TREATMENT FOR ORAL HEALTH PROBLEMS If you have oral health problems, treatment varies depending on the conditions present in your teeth and gums.  Your caregiver will most likely recommend good oral hygiene at each visit.  For cavities, gingivitis, or other oral health disease, your caregiver will perform a procedure to treat the problem. This is typically done at a separate appointment. Sometimes your caregiver will refer you to another dental specialist for specific tooth problems or for surgery. SEEK IMMEDIATE DENTAL CARE IF:  You have pain, bleeding, or soreness in the gum, tooth, jaw, or mouth area.  A permanent tooth becomes loose or separated from the gum socket.  You experience a blow or injury to the mouth or jaw area. Document Released: 08/11/2011 Document Revised: 02/21/2012 Document Reviewed: 08/11/2011 [facility] Patient Information 2015 [place], [place]. This information is not intended to replace advice given to you by your health care provider. Make sure you discuss any questions you have with your health care provider.  Dental Pain A tooth ache may be caused by cavities (tooth decay). Cavities expose the nerve of the tooth to air and hot or cold temperatures. It may come from an infection or abscess (also called a boil or furuncle) around your tooth. It is also often caused by dental caries (tooth decay). This causes the pain you are having. DIAGNOSIS  Your  caregiver can diagnose this problem by exam. TREATMENT   If caused by an infection, it may be treated with medications which kill germs (antibiotics) and pain medications as prescribed by your caregiver. Take medications as directed.  Only take over-the-counter or prescription medicines for pain, discomfort, or fever as directed by your caregiver.  Whether the tooth ache today is caused by infection or dental disease, you should see your dentist as soon as possible for further care. SEEK MEDICAL CARE IF: The exam and treatment you received today has been provided on an emergency basis only. This is not a substitute for complete medical or dental care. If your problem worsens or new problems (symptoms) appear, and you are unable to meet with your dentist, call or return to this location. SEEK IMMEDIATE MEDICAL CARE IF:   You have a fever.  You develop redness and swelling of your face, jaw, or neck.  You are unable to open your mouth.  You have severe pain uncontrolled by pain medicine. MAKE SURE YOU:   Understand these instructions.  Will watch your condition.  Will get help right away if you are not doing well or get worse. Document Released: 11/29/2005 Document Revised: 02/21/2012 Document Reviewed: 07/17/2008 [facility] Patient Information 2015 [place], [place]. This information is not intended to replace advice given to you by your health care provider. Make sure you discuss any questions you have with your health care provider.

## 2015-08-13 MED FILL — Hydrocodone-Acetaminophen Tab 5-325 MG: ORAL | Qty: 6 | Status: AC

## 2016-05-17 ENCOUNTER — Emergency Department (HOSPITAL_COMMUNITY)
Admission: EM | Admit: 2016-05-17 | Discharge: 2016-05-17 | Disposition: A | Discharge status: Home / Self Care | Payer: Self-pay | Attending: Emergency Medicine | Admitting: Emergency Medicine

## 2016-05-17 ENCOUNTER — Encounter (HOSPITAL_COMMUNITY): Payer: Self-pay | Admitting: Emergency Medicine

## 2016-05-17 DIAGNOSIS — K0889 Other specified disorders of teeth and supporting structures: Secondary | ICD-10-CM | POA: Insufficient documentation

## 2016-05-17 DIAGNOSIS — F17210 Nicotine dependence, cigarettes, uncomplicated: Secondary | ICD-10-CM | POA: Insufficient documentation

## 2016-05-17 MED ORDER — CLINDAMYCIN HCL 150 MG PO CAPS
300.0000 mg | ORAL_CAPSULE | Freq: Once | ORAL | Status: AC
  Administered 2016-05-17: 300 mg via ORAL
  Filled 2016-05-17: qty 2

## 2016-05-17 MED ORDER — HYDROCODONE-ACETAMINOPHEN 5-325 MG PO TABS
1.0000 | ORAL_TABLET | Freq: Once | ORAL | Status: AC
  Administered 2016-05-17: 1 via ORAL
  Filled 2016-05-17: qty 1

## 2016-05-17 MED ORDER — CLINDAMYCIN HCL 150 MG PO CAPS: 300.0000 mg | ORAL_CAPSULE | Freq: Four times a day (QID) | ORAL | Status: DC

## 2016-05-17 MED ORDER — DICLOFENAC SODIUM 75 MG PO TBEC: 75.0000 mg | DELAYED_RELEASE_TABLET | Freq: Two times a day (BID) | ORAL | Status: DC

## 2016-05-17 NOTE — ED Notes (Signed)
Pt c/o right lower dental pain x 3 days. 

## 2016-05-17 NOTE — ED Provider Notes (Signed)
History  By signing my name below, I, Donald Haynes, attest that this documentation has been prepared under the direction and in the presence of Donald Rappaport, PA-C. Electronically Signed: Earmon Haynes, ED Scribe. 05/17/2016. 4:01 PM.  Chief Complaint  Patient presents with  . Dental Pain   The history is provided by the patient and medical records. No language interpreter was used.    HPI Comments:  Donald Haynes is a 45 y.o. male who presents to the Emergency Department complaining of right lower dental pain that began three days ago. He states he tried to remove the tooth himself by tying string around it and pulling it. He states this only caused the tooth to come out in fragments, leaving the root exposed. Pt states he went to a clinic in [place] and could not been seen until September. He went to a clinic in [place] but states it was too expensive. He has not taken anything for pain but has been applying Orajel with minimal relief of the pain. He denies modifying factors. He denies fever, facial swelling, chills, nausea, vomiting, difficulty breathing or swallowing. He reports allergies to tramadol and is unsure of his PCN allergy but states he has taken Amoxicillin in the past without issue.  History reviewed. No pertinent past medical history. History reviewed. No pertinent past surgical history. No family history on file. Social History  Substance Use Topics  . Smoking status: Current Every Day Smoker -- 2.00 packs/day    Types: Cigarettes  . Smokeless tobacco: None  . Alcohol Use: No    Review of Systems  Constitutional: Negative for fever, chills and appetite change.  HENT: Positive for dental problem. Negative for congestion, facial swelling, sore throat and trouble swallowing.   Eyes: Negative for pain and visual disturbance.  Gastrointestinal: Negative for nausea and vomiting.  Musculoskeletal: Negative for neck pain and neck stiffness.  Neurological:  Negative for dizziness, facial asymmetry and headaches.  Hematological: Negative for adenopathy.  All other systems reviewed and are negative.   Allergies  Penicillins and Tramadol  Home Medications   Prior to Admission medications   Not on File   Triage Vitals: BP 132/89 mmHg  Pulse 91  Temp(Src) 98.8 F (37.1 C) (Oral)  Resp 17  Ht 6' (1.829 m)  Wt 160 lb (72.576 kg)  BMI 21.70 kg/m2  SpO2 99% Physical Exam  Constitutional: He is oriented to person, place, and time. He appears well-developed and well-nourished.  HENT:  Head: Normocephalic and atraumatic.  Mouth/Throat: Uvula is midline, oropharynx is clear and moist and mucous membranes are normal. No trismus in the jaw. Abnormal dentition. Dental caries present. No dental abscesses or uvula swelling.  Widespread dental decay. Tenderness of the right lower second molar. No obvious dental abscess. Airway patent.  Eyes: EOM are normal.  Neck: Normal range of motion.  Cardiovascular: Normal rate, regular rhythm and normal heart sounds.  Exam reveals no gallop and no friction rub.   No murmur heard. Pulmonary/Chest: Effort normal and breath sounds normal. No respiratory distress. He has no wheezes. He has no rales.  Musculoskeletal: Normal range of motion.  Neurological: He is alert and oriented to person, place, and time.  Skin: Skin is warm and dry.  Psychiatric: He has a normal mood and affect. His behavior is normal.  Nursing note and vitals reviewed.   ED Course  Procedures (including critical care time) DIAGNOSTIC STUDIES: Oxygen Saturation is 99% on RA, normal by my interpretation.   COORDINATION OF  CARE: 3:56 PM- Will prescribe antibiotics. Pt verbalizes understanding and agrees to plan.  Medications - No data to display  Labs Review Labs Reviewed - No data to display  Imaging Review No results found. I have personally reviewed and evaluated these images and lab results as part of my medical  decision-making.   EKG Interpretation None      MDM   Final diagnoses:  Pain, dental    Dental pain with multiple dental caries.  No concerning sx's for abscess or Ludwig's angina.  Dental referral given. Rx for clinda and diclofenac.   I personally performed the services described in this documentation, which was scribed in my presence. The recorded information has been reviewed and is accurate.     Marchelle Rinella, Pauline Aus-C 05/18/16 2121  Bethann BerkshireJoseph Zammit, MD 05/20/16 1230

## 2017-04-18 ENCOUNTER — Emergency Department (HOSPITAL_COMMUNITY)
Admission: EM | Admit: 2017-04-18 | Discharge: 2017-04-18 | Disposition: A | Discharge status: Home / Self Care | Payer: Self-pay | Attending: Emergency Medicine | Admitting: Emergency Medicine

## 2017-04-18 ENCOUNTER — Encounter (HOSPITAL_COMMUNITY): Payer: Self-pay | Admitting: *Deleted

## 2017-04-18 DIAGNOSIS — F17210 Nicotine dependence, cigarettes, uncomplicated: Secondary | ICD-10-CM | POA: Insufficient documentation

## 2017-04-18 DIAGNOSIS — M5441 Lumbago with sciatica, right side: Secondary | ICD-10-CM | POA: Insufficient documentation

## 2017-04-18 DIAGNOSIS — M5431 Sciatica, right side: Secondary | ICD-10-CM

## 2017-04-18 DIAGNOSIS — F1290 Cannabis use, unspecified, uncomplicated: Secondary | ICD-10-CM | POA: Insufficient documentation

## 2017-04-18 MED ORDER — IBUPROFEN 800 MG PO TABS: 800.0000 mg | ORAL_TABLET | Freq: Three times a day (TID) | ORAL | 0 refills | Status: DC

## 2017-04-18 MED ORDER — DEXAMETHASONE SODIUM PHOSPHATE 10 MG/ML IJ SOLN
10.0000 mg | Freq: Once | INTRAMUSCULAR | Status: AC
  Administered 2017-04-18: 10 mg via INTRAMUSCULAR
  Filled 2017-04-18: qty 1

## 2017-04-18 NOTE — Discharge Instructions (Signed)
Please obtain all of your results from medical records or have your doctors office obtain the results - share them with your doctor - you should be seen at your doctors office in the next 2 days. Call today to arrange your follow up. Take the medications as prescribed. Please review all of the medicines and only take them if you do not have an allergy to them. Please be aware that if you are taking birth control pills, taking other prescriptions, ESPECIALLY ANTIBIOTICS may make the birth control ineffective - if this is the case, either do not engage in sexual activity or use alternative methods of birth control such as condoms until you have finished the medicine and your family doctor says it is OK to restart them. If you are on a blood thinner such as COUMADIN, be aware that any other medicine that you take may cause the coumadin to either work too much, or not enough - you should have your coumadin level rechecked in next 7 days if this is the case.  °?  °It is also a possibility that you have an allergic reaction to any of the medicines that you have been prescribed - Everybody reacts differently to medications and while MOST people have no trouble with most medicines, you may have a reaction such as nausea, vomiting, rash, swelling, shortness of breath. If this is the case, please stop taking the medicine immediately and contact your physician.  °?  °You should return to the ER if you develop severe or worsening symptoms.  ° °[facility] Doctor List ° ° ° °Edward Hawkins MD. Specialty: Pulmonary Disease Contact information: [address]  °[address]  °[place] [address]  °[phone]  ° °Margaret Simpson, MD. Specialty: Family Medicine Contact information: [address]  °[place] [address]  °[phone]  ° °Scott Luking, MD. Specialty: Family Medicine Contact information: [address]  °[address]  °[place] [address]  °[phone]  ° °Tesfaye Fanta, MD Specialty:  Internal Medicine Contact information: [address]  °[place] [address]  °[phone]  ° °Zach Hall, MD. Specialty: Internal Medicine Contact information: [address]  °[place] [address]  °[phone]  ° °Angus Mcinnis, MD. Specialty: Family Medicine Contact information: [address]  °[place] [address]  °[phone]  ° °Stephen Knowlton, MD. Specialty: Family Medicine Contact information: [address]  °[address]  °[place] [address]  °[phone]  ° °Roy Fagan, MD. Specialty: Internal Medicine Contact information: [address]  °[address]  ° [place] [address]  °[phone]  ° °

## 2017-04-18 NOTE — ED Provider Notes (Signed)
[facility]-EMERG[facility]Y DEPT Provider Note   CSN: [id number] Arrival date & time: 04/18/17  0745  By signing my name below, I, Marnette Burgessyan Andrew Long, attest that this documentation has been prepared under the direction and in the presence of Eber HongMiller, Ami Thornsberry, MD. Electronically Signed: Marnette Burgessyan Andrew Long, Scribe. 04/18/2017. 8:12 AM.   History   Chief Complaint Chief Complaint  Patient presents with  . Back Pain   The history is provided by the patient and medical records. No language interpreter was used.    HPI Comments:  Donald Haynes is a 46 y.o. male with no pertinent PMHx, who presents to the Emergency Department complaining of constant, 8/10 sharp, right lower back pain onset about 1 week ago - worse with position including bending over. Pt reports this pain arising in his lower back two weeks ago and persisting since onset. He notes the pain radiates into his right buttock and right lower leg and is qualified as sharp. Pt has an associated symptom of right leg pain. Bending over and lifting heavy objects while at work exacerbates his pain. No home Tx tried to alleviate his pain. He denies upper back pain. No prior back surgery. No fever, abdominal pain, numbness, or h/o CA or IV drug abuse. Pt is a current every day smoker.    History reviewed. No pertinent past medical history.  There are no active problems to display for this patient.   Past Surgical History:  Procedure Laterality Date  . LEG SURGERY Right    due to injury from saw    Home Medications    Prior to Admission medications   Medication Sig Start Date End Date Taking? Authorizing Provider  ibuprofen (ADVIL,MOTRIN) 800 MG tablet Take 1 tablet (800 mg total) by mouth 3 (three) times daily. 04/18/17   Eber HongMiller, Moreen Piggott, MD    Family History No family history on file.  Social History Social History  Substance Use Topics  . Smoking status: Current Every Day Smoker    Packs/day: 2.00    Types: Cigarettes  . Smokeless tobacco:  Never Used  . Alcohol use No     Allergies   Penicillins and Tramadol   Review of Systems Review of Systems  Musculoskeletal: Positive for back pain and myalgias.  Neurological: Negative for numbness.    Physical Exam Updated Vital Signs BP (!) 152/99   Pulse 88   Temp 98.8 F (37.1 C) (Oral)   Resp 16   Ht 6' (1.829 m)   Wt 160 lb (72.6 kg)   SpO2 99%   BMI 21.70 kg/m   Physical Exam  Constitutional: He is oriented to person, place, and time. He appears well-developed and well-nourished.  HENT:  Head: Normocephalic.  Eyes: Conjunctivae are normal.  Cardiovascular: Normal rate.   Pulmonary/Chest: Effort normal.  Abdominal: He exhibits no distension.  Musculoskeletal: Normal range of motion. He exhibits tenderness.  Tenderness in right lower back and right lower buttock  Normal  Gait (slight antalgic), normal strength and sensation in the legs.  Neurological: He is alert and oriented to person, place, and time.  Normal speech and coordination  Skin: Skin is warm and dry.  Psychiatric: He has a normal mood and affect.  Nursing note and vitals reviewed.    ED Treatments / Results  DIAGNOSTIC STUDIES:  Oxygen Saturation is 99% on RA, normal by my interpretation.    COORDINATION OF CARE:  8:10 AM Discussed treatment plan with pt at bedside including steroids and pt agreed to plan.  Labs (all labs ordered are listed, but only abnormal results are displayed) Labs Reviewed - No data to display   Radiology No results found.  Procedures Procedures (including critical care time)  Medications Ordered in ED Medications  dexamethasone (DECADRON) injection 10 mg (10 mg Intramuscular Given 04/18/17 0835)     Initial Impression / Assessment and Plan / ED Course  I have reviewed the triage vital signs and the nursing notes.  Pertinent labs & imaging results that were available during my care of the patient were reviewed by me and considered in my medical  decision making (see chart for details).     Has sciatica cilinically Denies red flags The patient has no upper back or neck pain, no fever, no hx of cancer, IVDU, recent spinal procedures, weakness or numbness of the lower extremities and no urinary complaints including no retention or incontinence Decadron, motrin, pt requesting tylenol 3 Of note his son who is also here had vicodin Rx at his last weeks visit (for sprain?)  Final Clinical Impressions(s) / ED Diagnoses   Final diagnoses:  Sciatica of right side    New Prescriptions New Prescriptions   IBUPROFEN (ADVIL,MOTRIN) 800 MG TABLET    Take 1 tablet (800 mg total) by mouth 3 (three) times daily.    I personally performed the services described in this documentation, which was scribed in my presence. The recorded information has been reviewed and is accurate.       Eber Hong, MD 04/18/17 [phone]

## 2017-04-18 NOTE — ED Triage Notes (Addendum)
Pt c/o lower back pain that shoots down into the right buttocks and right leg x several weeks. Denies injury. Pt ambulatory. Reports the pain is worse when sitting or standing.

## 2017-07-02 ENCOUNTER — Emergency Department (HOSPITAL_COMMUNITY)
Admission: EM | Admit: 2017-07-02 | Discharge: 2017-07-02 | Disposition: A | Discharge status: Home / Self Care | Payer: Self-pay | Attending: Emergency Medicine | Admitting: Emergency Medicine

## 2017-07-02 ENCOUNTER — Encounter (HOSPITAL_COMMUNITY): Payer: Self-pay | Admitting: *Deleted

## 2017-07-02 ENCOUNTER — Emergency Department (HOSPITAL_COMMUNITY): Payer: Self-pay

## 2017-07-02 DIAGNOSIS — R0602 Shortness of breath: Secondary | ICD-10-CM | POA: Insufficient documentation

## 2017-07-02 DIAGNOSIS — F17210 Nicotine dependence, cigarettes, uncomplicated: Secondary | ICD-10-CM | POA: Insufficient documentation

## 2017-07-02 DIAGNOSIS — R319 Hematuria, unspecified: Secondary | ICD-10-CM | POA: Insufficient documentation

## 2017-07-02 DIAGNOSIS — R1030 Lower abdominal pain, unspecified: Secondary | ICD-10-CM | POA: Insufficient documentation

## 2017-07-02 DIAGNOSIS — R634 Abnormal weight loss: Secondary | ICD-10-CM | POA: Insufficient documentation

## 2017-07-02 LAB — COMPREHENSIVE METABOLIC PANEL
ALK PHOS: 46 U/L (ref 38–126)
ALT: 18 U/L (ref 17–63)
ANION GAP: 8 (ref 5–15)
AST: 18 U/L (ref 15–41)
Albumin: 4.1 g/dL (ref 3.5–5.0)
BUN: 15 mg/dL (ref 6–20)
CALCIUM: 9.3 mg/dL (ref 8.9–10.3)
CO2: 31 mmol/L (ref 22–32)
CREATININE: 0.8 mg/dL (ref 0.61–1.24)
Chloride: 102 mmol/L (ref 101–111)
Glucose, Bld: 99 mg/dL (ref 65–99)
Potassium: 3.5 mmol/L (ref 3.5–5.1)
Sodium: 141 mmol/L (ref 135–145)
Total Bilirubin: 0.4 mg/dL (ref 0.3–1.2)
Total Protein: 7.3 g/dL (ref 6.5–8.1)

## 2017-07-02 LAB — CBC
HCT: 35.4 % — ABNORMAL LOW (ref 39.0–52.0)
HEMOGLOBIN: 12.1 g/dL — AB (ref 13.0–17.0)
MCH: 30.3 pg (ref 26.0–34.0)
MCHC: 34.2 g/dL (ref 30.0–36.0)
MCV: 88.5 fL (ref 78.0–100.0)
PLATELETS: 303 10*3/uL (ref 150–400)
RBC: 4 MIL/uL — AB (ref 4.22–5.81)
RDW: 14 % (ref 11.5–15.5)
WBC: 6.1 10*3/uL (ref 4.0–10.5)

## 2017-07-02 LAB — URINALYSIS, ROUTINE W REFLEX MICROSCOPIC
Bilirubin Urine: NEGATIVE
Glucose, UA: NEGATIVE mg/dL
LEUKOCYTES UA: NEGATIVE
NITRITE: NEGATIVE
PROTEIN: 100 mg/dL — AB
SPECIFIC GRAVITY, URINE: 1.02 (ref 1.005–1.030)
pH: 6.5 (ref 5.0–8.0)

## 2017-07-02 LAB — DIFFERENTIAL
Basophils Absolute: 0 10*3/uL (ref 0.0–0.1)
Basophils Relative: 1 %
EOS ABS: 0.2 10*3/uL (ref 0.0–0.7)
EOS PCT: 4 %
LYMPHS ABS: 2.8 10*3/uL (ref 0.7–4.0)
LYMPHS PCT: 45 %
MONOS PCT: 12 %
Monocytes Absolute: 0.7 10*3/uL (ref 0.1–1.0)
Neutro Abs: 2.3 10*3/uL (ref 1.7–7.7)
Neutrophils Relative %: 38 %

## 2017-07-02 LAB — URINALYSIS, MICROSCOPIC (REFLEX)

## 2017-07-02 LAB — LIPASE, BLOOD: LIPASE: 19 U/L (ref 11–51)

## 2017-07-02 MED ORDER — MORPHINE SULFATE (PF) 4 MG/ML IV SOLN
4.0000 mg | Freq: Once | INTRAVENOUS | Status: AC
  Administered 2017-07-02: 4 mg via INTRAVENOUS
  Filled 2017-07-02: qty 1

## 2017-07-02 MED ORDER — IOPAMIDOL (ISOVUE-300) INJECTION 61%
100.0000 mL | Freq: Once | INTRAVENOUS | Status: AC | PRN
  Administered 2017-07-02: 100 mL via INTRAVENOUS

## 2017-07-02 MED ORDER — OMEPRAZOLE 20 MG PO CPDR: 20.0000 mg | DELAYED_RELEASE_CAPSULE | Freq: Every day | ORAL | 0 refills | Status: DC

## 2017-07-02 NOTE — Discharge Instructions (Signed)
Follow-up the primary care doctor for further evaluation.

## 2017-07-02 NOTE — ED Provider Notes (Signed)
[facility] Provider Note   CSN: [id number] Arrival date & time: 07/02/17  1419     History   Chief Complaint Chief Complaint  Patient presents with  . Hematuria    HPI Donald Haynes is a 46 y.o. male.  HPI Patient presents with blood in the urine. His had it for the last couple days. Began also with nausea and lower abdominal pain. No blood in the stool. States he has lost around 10 pounds in last couple weeks. States had decreased appetite. No fevers. No other bleeding. Does have some bleeding when he brushes his teeth but no new bleeding. He has dull lower abdominal pain. No black stool. States he is worried he could have hepatitis C due to previous lifestyle choices. States he has had red blood coming out when he urinates.  History reviewed. No pertinent past medical history.  There are no active problems to display for this patient.   Past Surgical History:  Procedure Laterality Date  . LEG SURGERY Right    due to injury from saw       Home Medications    Prior to Admission medications   Medication Sig Start Date End Date Taking? Authorizing Provider  ibuprofen (ADVIL,MOTRIN) 800 MG tablet Take 1 tablet (800 mg total) by mouth 3 (three) times daily. Patient not taking: Reported on 07/02/2017 04/18/17   Eber Hong, MD  omeprazole (PRILOSEC) 20 MG capsule Take 1 capsule (20 mg total) by mouth daily. 07/02/17   Benjiman Core, MD    Family History No family history on file.  Social History Social History  Substance Use Topics  . Smoking status: Current Every Day Smoker    Packs/day: 2.00    Types: Cigarettes  . Smokeless tobacco: Never Used  . Alcohol use No     Allergies   Penicillins and Tramadol   Review of Systems Review of Systems  Constitutional: Positive for appetite change and unexpected weight change.  HENT: Negative for congestion.   Respiratory: Negative for cough.   Cardiovascular: Negative for chest pain.    Gastrointestinal: Positive for abdominal pain.  Genitourinary: Positive for hematuria. Negative for dysuria.  Musculoskeletal: Positive for back pain.  Neurological: Positive for weakness.  Hematological: Does not bruise/bleed easily.  Psychiatric/Behavioral: The patient is not nervous/anxious.      Physical Exam Updated Vital Signs BP 100/61   Pulse 82   Temp 97.6 F (36.4 C) (Oral)   Resp 17   Ht 6' (1.829 m)   Wt 72.6 kg (160 lb)   SpO2 99%   BMI 21.70 kg/m   Physical Exam  Constitutional: He appears well-developed.  HENT:  Head: Normocephalic.  Eyes: No scleral icterus.  Cardiovascular: Normal rate.   Pulmonary/Chest: Effort normal. He has no wheezes. He has no rales.  Abdominal: Soft.  Genitourinary: Penis normal.  Neurological: He is alert.  Skin: Skin is warm. Capillary refill takes less than 2 seconds.  Psychiatric: He has a normal mood and affect.     ED Treatments / Results  Labs (all labs ordered are listed, but only abnormal results are displayed) Labs Reviewed  CBC - Abnormal; Notable for the following:       Result Value   RBC 4.00 (*)    Hemoglobin 12.1 (*)    HCT 35.4 (*)    All other components within normal limits  URINALYSIS, ROUTINE W REFLEX MICROSCOPIC - Abnormal; Notable for the following:    Color, Urine AMBER (*)  APPearance HAZY (*)    Hgb urine dipstick LARGE (*)    Ketones, ur TRACE (*)    Protein, ur 100 (*)    All other components within normal limits  URINALYSIS, MICROSCOPIC (REFLEX) - Abnormal; Notable for the following:    Bacteria, UA FEW (*)    Squamous Epithelial / LPF 0-5 (*)    All other components within normal limits  LIPASE, BLOOD  COMPREHENSIVE METABOLIC PANEL  DIFFERENTIAL    EKG  EKG Interpretation None       Radiology Dg Chest 2 View  Result Date: 07/02/2017 CLINICAL DATA:  Three day history of hematuria with weakness and weight loss. EXAM: CHEST  2 VIEW COMPARISON:  None. FINDINGS: Lungs are  hyperexpanded. The lungs are clear without focal pneumonia, edema, pneumothorax or pleural effusion. 10 mm nodule projects at the left lower lung between the anterior fifth and sixth ribs. No similar nodule on the contralateral side. The cardiopericardial silhouette is within normal limits for size. The visualized bony structures of the thorax are intact. IMPRESSION: 1. Small nodule over the left lung may be a nipple shadow but no similar contralateral nodule is evident. As such, repeat frontal radiograph with nipple markers recommended to confirm. Alternatively, CT chest without contrast could be used to further evaluate. 2. Emphysema without acute cardiopulmonary findings. Electronically Signed   By: Kennith CenterEric  Mansell M.D.   On: 07/02/2017 16:23   Ct Chest W Contrast  Result Date: 07/02/2017 CLINICAL DATA:  Hematuria with generalized lower abdominal and low back pain. Hemoptysis. Intermittent shortness of breath. EXAM: CT CHEST, ABDOMEN, AND PELVIS WITH CONTRAST TECHNIQUE: Multidetector CT imaging of the chest, abdomen and pelvis was performed following the standard protocol during bolus administration of intravenous contrast. CONTRAST:  100mL ISOVUE-300 IOPAMIDOL (ISOVUE-300) INJECTION 61% COMPARISON:  Abdomen and pelvis CT 05/29/2014 FINDINGS: CT CHEST FINDINGS Cardiovascular: The heart size is normal. No pericardial effusion. No thoracic aortic aneurysm. No large central pulmonary embolus. Mediastinum/Nodes: No mediastinal lymphadenopathy. There is no hilar lymphadenopathy. The esophagus has normal imaging features. There is no axillary lymphadenopathy. Lungs/Pleura: Trace parenchymal scarring noted at the lung apices. No focal airspace consolidation. No pulmonary edema or pleural effusion. There is some minimal dependent atelectasis in the lower lungs bilaterally. Musculoskeletal: Bone windows reveal no worrisome lytic or sclerotic osseous lesions. CT ABDOMEN PELVIS FINDINGS Hepatobiliary: Small area of low  attenuation in the anterior liver, adjacent to the falciform ligament, is in a characteristic location for focal fatty deposition. There is no evidence for gallstones, gallbladder wall thickening, or pericholecystic fluid. No intrahepatic or extrahepatic biliary dilation. Pancreas: No focal mass lesion. No dilatation of the main duct. No intraparenchymal cyst. No peripancreatic edema. Spleen: No splenomegaly. No focal mass lesion. Adrenals/Urinary Tract: No adrenal nodule or mass. Kidneys are unremarkable. No evidence for hydroureter. The urinary bladder appears normal for the degree of distention. Stomach/Bowel: Stomach is nondistended. No gastric wall thickening. No evidence of outlet obstruction. Duodenum is normally positioned as is the ligament of Treitz. No small bowel wall thickening. No small bowel dilatation. The terminal ileum is normal. The appendix is not visualized, but there is no edema or inflammation in the region of the cecum. No gross colonic mass. No colonic wall thickening. No substantial diverticular change. Vascular/Lymphatic: There is abdominal aortic atherosclerosis without aneurysm. There is no gastrohepatic or hepatoduodenal ligament lymphadenopathy. No intraperitoneal or retroperitoneal lymphadenopathy. No pelvic sidewall lymphadenopathy. Reproductive: The prostate gland and seminal vesicles have normal imaging features. Other: No intraperitoneal free fluid.  Musculoskeletal: Bone windows reveal no worrisome lytic or sclerotic osseous lesions. IMPRESSION: 1. No acute findings in the chest, abdomen, or pelvis. Specifically, no findings to explain the patient's history of hemoptysis with cough, hematuria, or low abdominal pain. Electronically Signed   By: Kennith Center M.D.   On: 07/02/2017 19:26   Ct Abdomen Pelvis W Contrast  Result Date: 07/02/2017 CLINICAL DATA:  Hematuria with generalized lower abdominal and low back pain. Hemoptysis. Intermittent shortness of breath. EXAM: CT CHEST,  ABDOMEN, AND PELVIS WITH CONTRAST TECHNIQUE: Multidetector CT imaging of the chest, abdomen and pelvis was performed following the standard protocol during bolus administration of intravenous contrast. CONTRAST:  ISOVUE-300 IOPAMIDOL (ISOVUE-300) INJECTION 61% COMPARISON:  Abdomen and pelvis CT 05/29/2014 FINDINGS: CT CHEST FINDINGS Cardiovascular: The heart size is normal. No pericardial effusion. No thoracic aortic aneurysm. No large central pulmonary embolus. Mediastinum/Nodes: No mediastinal lymphadenopathy. There is no hilar lymphadenopathy. The esophagus has normal imaging features. There is no axillary lymphadenopathy. Lungs/Pleura: Trace parenchymal scarring noted at the lung apices. No focal airspace consolidation. No pulmonary edema or pleural effusion. There is some minimal dependent atelectasis in the lower lungs bilaterally. Musculoskeletal: Bone windows reveal no worrisome lytic or sclerotic osseous lesions. CT ABDOMEN PELVIS FINDINGS Hepatobiliary: Small area of low attenuation in the anterior liver, adjacent to the falciform ligament, is in a characteristic location for focal fatty deposition. There is no evidence for gallstones, gallbladder wall thickening, or pericholecystic fluid. No intrahepatic or extrahepatic biliary dilation. Pancreas: No focal mass lesion. No dilatation of the main duct. No intraparenchymal cyst. No peripancreatic edema. Spleen: No splenomegaly. No focal mass lesion. Adrenals/Urinary Tract: No adrenal nodule or mass. Kidneys are unremarkable. No evidence for hydroureter. The urinary bladder appears normal for the degree of distention. Stomach/Bowel: Stomach is nondistended. No gastric wall thickening. No evidence of outlet obstruction. Duodenum is normally positioned as is the ligament of Treitz. No small bowel wall thickening. No small bowel dilatation. The terminal ileum is normal. The appendix is not visualized, but there is no edema or inflammation in the region of  the cecum. No gross colonic mass. No colonic wall thickening. No substantial diverticular change. Vascular/Lymphatic: There is abdominal aortic atherosclerosis without aneurysm. There is no gastrohepatic or hepatoduodenal ligament lymphadenopathy. No intraperitoneal or retroperitoneal lymphadenopathy. No pelvic sidewall lymphadenopathy. Reproductive: The prostate gland and seminal vesicles have normal imaging features. Other: No intraperitoneal free fluid. Musculoskeletal: Bone windows reveal no worrisome lytic or sclerotic osseous lesions. IMPRESSION: 1. No acute findings in the chest, abdomen, or pelvis. Specifically, no findings to explain the patient's history of hemoptysis with cough, hematuria, or low abdominal pain. Electronically Signed   By: Kennith Center M.D.   On: 07/02/2017 19:26    Procedures Procedures (including critical care time)  Medications Ordered in ED Medications  morphine 4 MG/ML injection 4 mg (4 mg Intravenous Given 07/02/17 1708)  morphine 4 MG/ML injection 4 mg (4 mg Intravenous Given 07/02/17 1811)  iopamidol (ISOVUE-300) 61 % injection 100 mL (100 mLs Intravenous Contrast Given 07/02/17 1831)     Initial Impression / Assessment and Plan / ED Course  I have reviewed the triage vital signs and the nursing notes.  Pertinent labs & imaging results that were available during my care of the patient were reviewed by me and considered in my medical decision making (see chart for details).     Patient with pain and hematuria. Has had weight loss. He is a smoker. Does have hematuria but otherwise labs  reassuring. CT scan was done to possible nodule on x-ray and abdominal pain with weight loss. Both reassuring. Will need to follow-up with the primary care physician.  Final Clinical Impressions(s) / ED Diagnoses   Final diagnoses:  Hematuria, unspecified type  Weight loss  Lower abdominal pain    New Prescriptions Discharge Medication List as of 07/02/2017  8:02 PM      START taking these medications   Details  omeprazole (PRILOSEC) 20 MG capsule Take 1 capsule (20 mg total) by mouth daily., Starting Sat 07/02/2017, Print         Benjiman Core, MD 07/03/17 [facility] Manners

## 2017-07-02 NOTE — ED Triage Notes (Signed)
Pt comes in with because he has had blood in her urine for 3 days. Pt denies any blood thinner use. Pt has been having lower abdominal pain and lower back pain. In addition to this, he has been vomiting as well.

## 2017-11-05 DIAGNOSIS — F17210 Nicotine dependence, cigarettes, uncomplicated: Secondary | ICD-10-CM | POA: Insufficient documentation

## 2017-11-05 DIAGNOSIS — M545 Low back pain: Secondary | ICD-10-CM | POA: Insufficient documentation

## 2017-11-05 DIAGNOSIS — G8929 Other chronic pain: Secondary | ICD-10-CM | POA: Insufficient documentation

## 2017-11-05 DIAGNOSIS — Y999 Unspecified external cause status: Secondary | ICD-10-CM | POA: Insufficient documentation

## 2017-11-05 DIAGNOSIS — Y929 Unspecified place or not applicable: Secondary | ICD-10-CM | POA: Insufficient documentation

## 2017-11-05 DIAGNOSIS — M5431 Sciatica, right side: Secondary | ICD-10-CM | POA: Insufficient documentation

## 2017-11-05 DIAGNOSIS — Z79899 Other long term (current) drug therapy: Secondary | ICD-10-CM | POA: Insufficient documentation

## 2017-11-05 DIAGNOSIS — Y939 Activity, unspecified: Secondary | ICD-10-CM | POA: Insufficient documentation

## 2017-11-05 DIAGNOSIS — W1789XA Other fall from one level to another, initial encounter: Secondary | ICD-10-CM | POA: Insufficient documentation

## 2017-11-06 ENCOUNTER — Emergency Department (HOSPITAL_COMMUNITY): Payer: Self-pay

## 2017-11-06 ENCOUNTER — Emergency Department (HOSPITAL_COMMUNITY)
Admission: EM | Admit: 2017-11-06 | Discharge: 2017-11-06 | Disposition: A | Discharge status: Home / Self Care | Payer: Self-pay | Attending: Emergency Medicine | Admitting: Emergency Medicine

## 2017-11-06 ENCOUNTER — Encounter (HOSPITAL_COMMUNITY): Payer: Self-pay | Admitting: *Deleted

## 2017-11-06 ENCOUNTER — Other Ambulatory Visit: Payer: Self-pay

## 2017-11-06 DIAGNOSIS — M5431 Sciatica, right side: Secondary | ICD-10-CM

## 2017-11-06 DIAGNOSIS — G8929 Other chronic pain: Secondary | ICD-10-CM

## 2017-11-06 DIAGNOSIS — M545 Low back pain, unspecified: Secondary | ICD-10-CM

## 2017-11-06 MED ORDER — KETOROLAC TROMETHAMINE 30 MG/ML IJ SOLN
30.0000 mg | Freq: Once | INTRAMUSCULAR | Status: AC
  Administered 2017-11-06: 30 mg via INTRAVENOUS
  Filled 2017-11-06: qty 1

## 2017-11-06 MED ORDER — OXYCODONE-ACETAMINOPHEN 5-325 MG PO TABS: 1.0000 | ORAL_TABLET | ORAL | 0 refills | Status: AC | PRN

## 2017-11-06 MED ORDER — NAPROXEN 500 MG PO TABS: 500.0000 mg | ORAL_TABLET | Freq: Two times a day (BID) | ORAL | 0 refills | Status: AC

## 2017-11-06 MED ORDER — METHOCARBAMOL 1000 MG/10ML IJ SOLN
1000.0000 mg | Freq: Once | INTRAVENOUS | Status: AC
  Administered 2017-11-06: 1000 mg via INTRAVENOUS
  Filled 2017-11-06: qty 10

## 2017-11-06 MED ORDER — METHOCARBAMOL 1000 MG/10ML IJ SOLN
INTRAMUSCULAR | Status: AC
Start: 2017-11-06 — End: 2017-11-06
  Filled 2017-11-06: qty 10

## 2017-11-06 MED ORDER — HYDROMORPHONE HCL 1 MG/ML IJ SOLN
1.0000 mg | Freq: Once | INTRAMUSCULAR | Status: AC
  Administered 2017-11-06: 1 mg via INTRAVENOUS
  Filled 2017-11-06: qty 1

## 2017-11-06 MED ORDER — ONDANSETRON HCL 4 MG/2ML IJ SOLN
4.0000 mg | Freq: Once | INTRAMUSCULAR | Status: AC
  Administered 2017-11-06: 4 mg via INTRAVENOUS
  Filled 2017-11-06: qty 2

## 2017-11-06 MED ORDER — CYCLOBENZAPRINE HCL 10 MG PO TABS: 10.0000 mg | ORAL_TABLET | Freq: Three times a day (TID) | ORAL | 0 refills | Status: AC | PRN

## 2017-11-06 NOTE — ED Notes (Signed)
Pt states that "the pain medicine did not help him at all" and "it felt like I got nothing". Informed pt I was going to give him som IV Robaxin now and that we would see if that helps his pain any. Will continue to monitor.

## 2017-11-06 NOTE — ED Provider Notes (Signed)
[facility] EMERGENCY DEPARTMENT Provider Note   CSN: [id number] Arrival date & time: 11/05/17  2337     History   Chief Complaint Chief Complaint  Patient presents with  . Back Pain    HPI Donald Haynes is a 46 y.o. male.  The history is provided by the patient.  He is complaining of pain in his lower back since falling off a roof about 1 month ago.  Pain does radiate to his right hip.  He states he has been having increasing pain in both hips and is having difficulty walking.  Pain is rated at 10/10.  He has been to emergency departments in [place] several times and has had MRI scan done.  He was told that he had a ruptured disc in his back.  He has been taking a number of medications including ibuprofen, hydrocodone, muscle relaxers without any significant relief of pain.  On one occasion, he did get an injection in the hospital emergency department which gave about 4 hours of relief.  He has been unable to get in to see the orthopedic surgeon in [place].  He relates that pain is so severe that he is unable to sleep at night.  He denies any urinary or fecal incontinence.  He does have occasional urinary urge incontinence.  History reviewed. No pertinent past medical history.  There are no active problems to display for this patient.   Past Surgical History:  Procedure Laterality Date  . LEG SURGERY Right    due to injury from saw       Home Medications    Prior to Admission medications   Medication Sig Start Date End Date Taking? Authorizing Provider  ibuprofen (ADVIL,MOTRIN) 800 MG tablet Take 1 tablet (800 mg total) by mouth 3 (three) times daily. 04/18/17  Yes Eber HongMiller, Brian, MD  omeprazole (PRILOSEC) 20 MG capsule Take 1 capsule (20 mg total) by mouth daily. 07/02/17  Yes Benjiman CorePickering, Nathan, MD    Family History History reviewed. No pertinent family history.  Social History Social History   Tobacco Use  . Smoking status: Current Every Day Smoker   Packs/day: 1.00    Types: Cigarettes  . Smokeless tobacco: Never Used  Substance Use Topics  . Alcohol use: No  . Drug use: Yes    Types: Marijuana    Comment: last used this morning 04/18/17     Allergies   Penicillins and Tramadol   Review of Systems Review of Systems  All other systems reviewed and are negative.    Physical Exam Updated Vital Signs BP (!) 124/96 (BP Location: Right Arm)   Pulse 83   Temp 98.1 F (36.7 C) (Oral)   Resp 18   Ht 6' (1.829 m)   Wt 68 kg (150 lb)   SpO2 98%   BMI 20.34 kg/m   Physical Exam  Nursing note and vitals reviewed.  46 year old male, resting comfortably and in no acute distress. Vital signs are significant for diastolic hypertension. Oxygen saturation is 98%, which is normal. Head is normocephalic and atraumatic. PERRLA, EOMI. Oropharynx is clear. Neck is nontender and supple without adenopathy or JVD. Back is very tender in the mid and lower lumbar spine.  There is moderate to severe bilateral paralumbar spasm.  Straight leg raise is positive on the right at 15 degrees, and crossed straight leg raises positive on the left at 15 degrees.  There is no CVA tenderness. Lungs are clear without rales, wheezes, or rhonchi. Chest  is nontender. Heart has regular rate and rhythm without murmur. Abdomen is soft, flat, nontender without masses or hepatosplenomegaly and peristalsis is normoactive. Extremities have no cyanosis or edema, full range of motion is present. Skin is warm and dry without rash. Neurologic: Mental status is normal, cranial nerves are intact, there are no motor or sensory deficits.  ED Treatments / Results   Radiology Dg Hips Bilat W Or Wo Pelvis 5 Views  Result Date: 11/06/2017 CLINICAL DATA:  Larey SeatFell 4 weeks ago. Right greater than left hip pain. Low back pain. EXAM: DG HIP (WITH OR WITHOUT PELVIS) 5+V BILAT COMPARISON:  None. FINDINGS: There is no evidence of hip fracture or dislocation. There is no evidence of  arthropathy or other focal bone abnormality. IMPRESSION: Negative. Electronically Signed   By: Burman NievesWilliam  Stevens M.D.   On: 11/06/2017 04:09    Procedures Procedures (including critical care time)  Medications Ordered in ED Medications  ketorolac (TORADOL) 30 MG/ML injection 30 mg (30 mg Intravenous Given 11/06/17 0342)  methocarbamol (ROBAXIN) 1,000 mg in dextrose 5 % 50 mL IVPB (0 mg Intravenous Stopped 11/06/17 0458)  HYDROmorphone (DILAUDID) injection 1 mg (1 mg Intravenous Given 11/06/17 0342)  ondansetron (ZOFRAN) injection 4 mg (4 mg Intravenous Given 11/06/17 0342)     Initial Impression / Assessment and Plan / ED Course  I have reviewed the triage vital signs and the nursing notes.  Low back pain with right-sided sciatica following fall off of the roof.  This injury is subacute at this point.  No indication for imaging today.  No neurologic deficit identified today.  At this point, will attempt to get pain control.  He will be given ketorolac, methocarbamol, hydromorphone.  He is complaining about his hips hurting, so will get x-rays of those..  I have informed the patient that he will need to obtain a CD of his MRI scan to take to a neurosurgeon for further evaluation.  Old records reviewed, and he had been seen in our emergency department for right-sided sciatica in May of this year.  Also, had an ED visit in 2015 for a fall at a construction site and did have a back injury at that time as well.  I [place] the [place] and [place] controlled substance database and I could find only 1 narcotic prescription for 10 hydrocodone-acetaminophen tablets filled on 11/04/2017.  Patient received above-noted treatment.  Hip x-rays are unremarkable.  When I went in to recheck him, he complained that the entire department here was totally screwed up and that he just wanted his discharge papers and wanted to leave.  He is discharged with prescriptions for naproxen and  cyclobenzaprine and also given a small number of oxycodone-acetaminophen tablets.  He is referred to neurosurgery with instructions to take the CD with his MRI with him when he sees the neurosurgeon.  Final Clinical Impressions(s) / ED Diagnoses   Final diagnoses:  Acute exacerbation of chronic low back pain  Sciatica of right side    ED Discharge Orders        Ordered    cyclobenzaprine (FLEXERIL) 10 MG tablet  3 times daily PRN     11/06/17 0546    naproxen (NAPROSYN) 500 MG tablet  2 times daily     11/06/17 0546    oxyCODONE-acetaminophen (PERCOCET) 5-325 MG tablet  Every 4 hours PRN     11/06/17 0546       Dione BoozeGlick, Gladis Soley, MD 11/06/17 [phone]

## 2017-11-06 NOTE — ED Notes (Signed)
Pt asleep.

## 2017-11-06 NOTE — ED Triage Notes (Signed)
Pt states fell appx 4 weeks ago seen in [place]. Pt states no improvement & was told possible rupture disc.

## 2017-11-06 NOTE — Discharge Instructions (Signed)
Apply ice several times a day.  Go to the hospital where you had your MRI scan and have them make a CD with your scan. Take that with you when you see the neurosurgeon.

## 2018-06-09 IMAGING — DX DG HIP (WITH OR WITHOUT PELVIS) 5+V BILAT
5 series · 5 of 5 positions shown · non-contrast
Comparison: None.

CLINICAL DATA: Fell 4 weeks ago. Right greater than left hip pain.
Low back pain.

EXAM:
DG HIP (WITH OR WITHOUT PELVIS) 5+V BILAT

[pelvis ap]
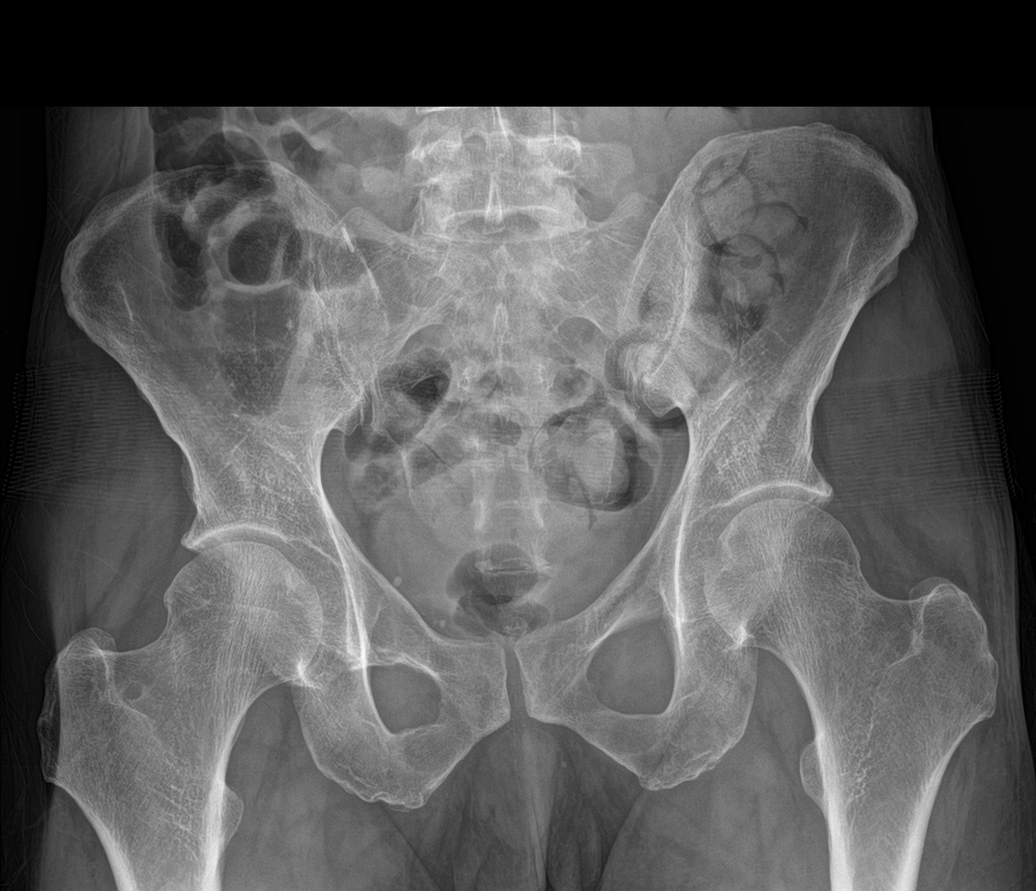

[hip ap (1 of 2)]
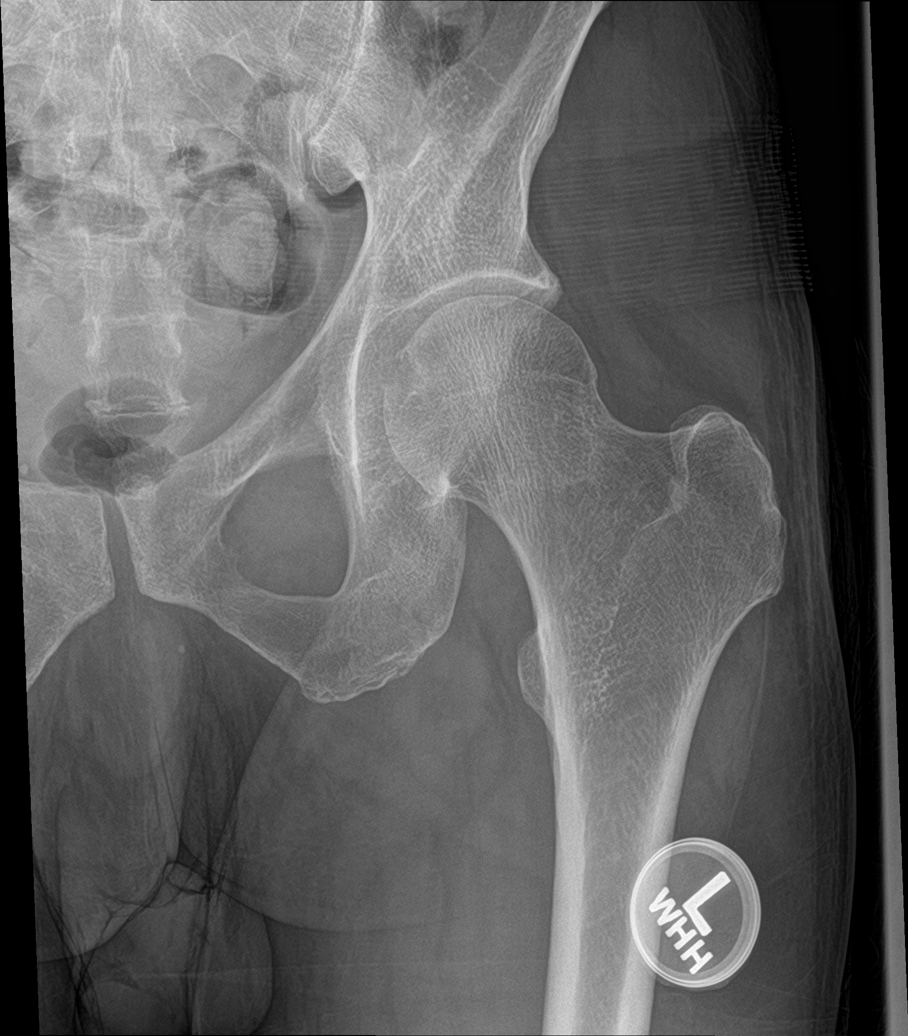

[hip lat (1 of 2)]
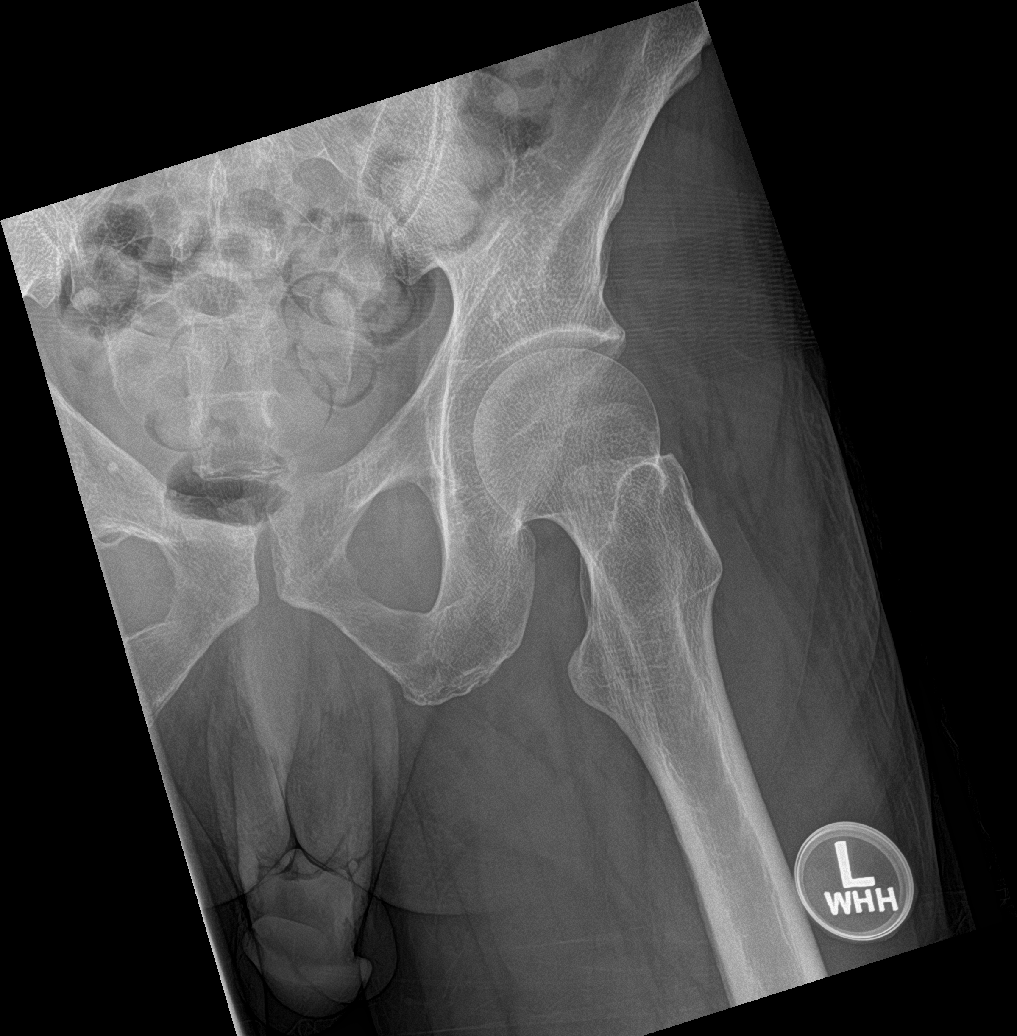

[hip ap (2 of 2)]
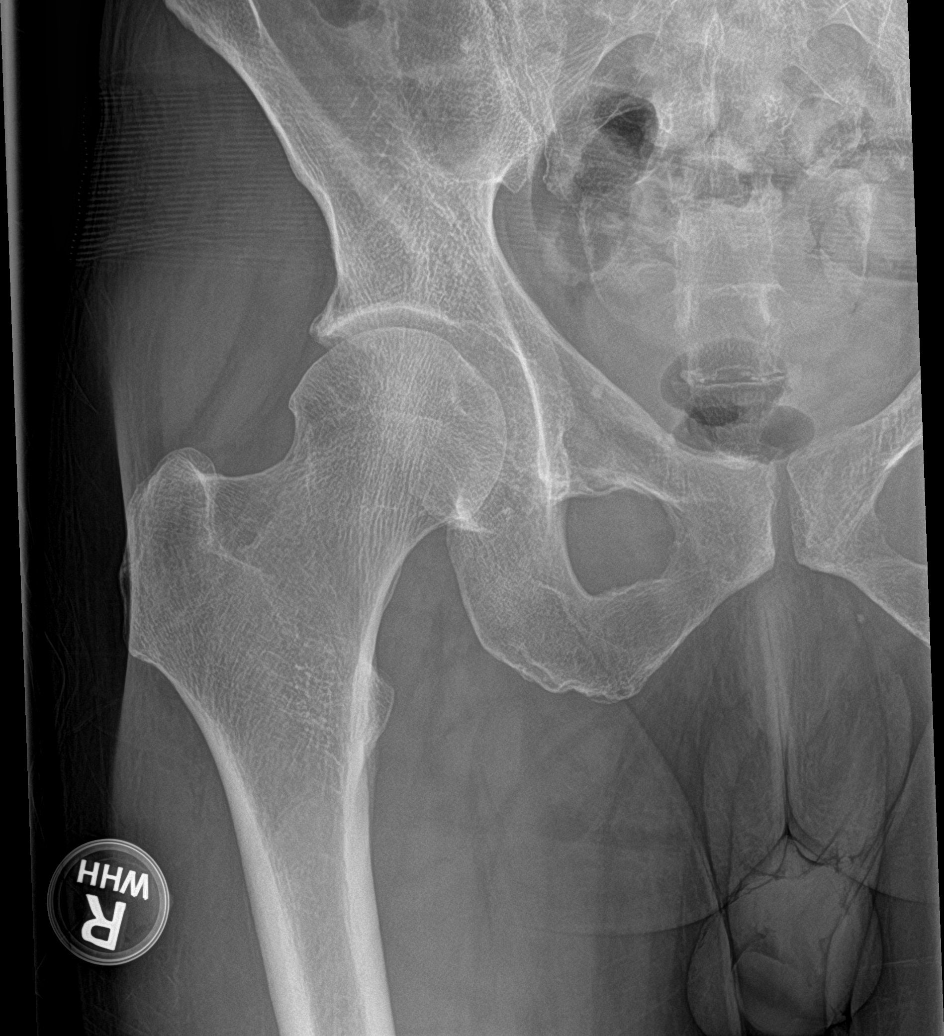

[hip lat (2 of 2)]
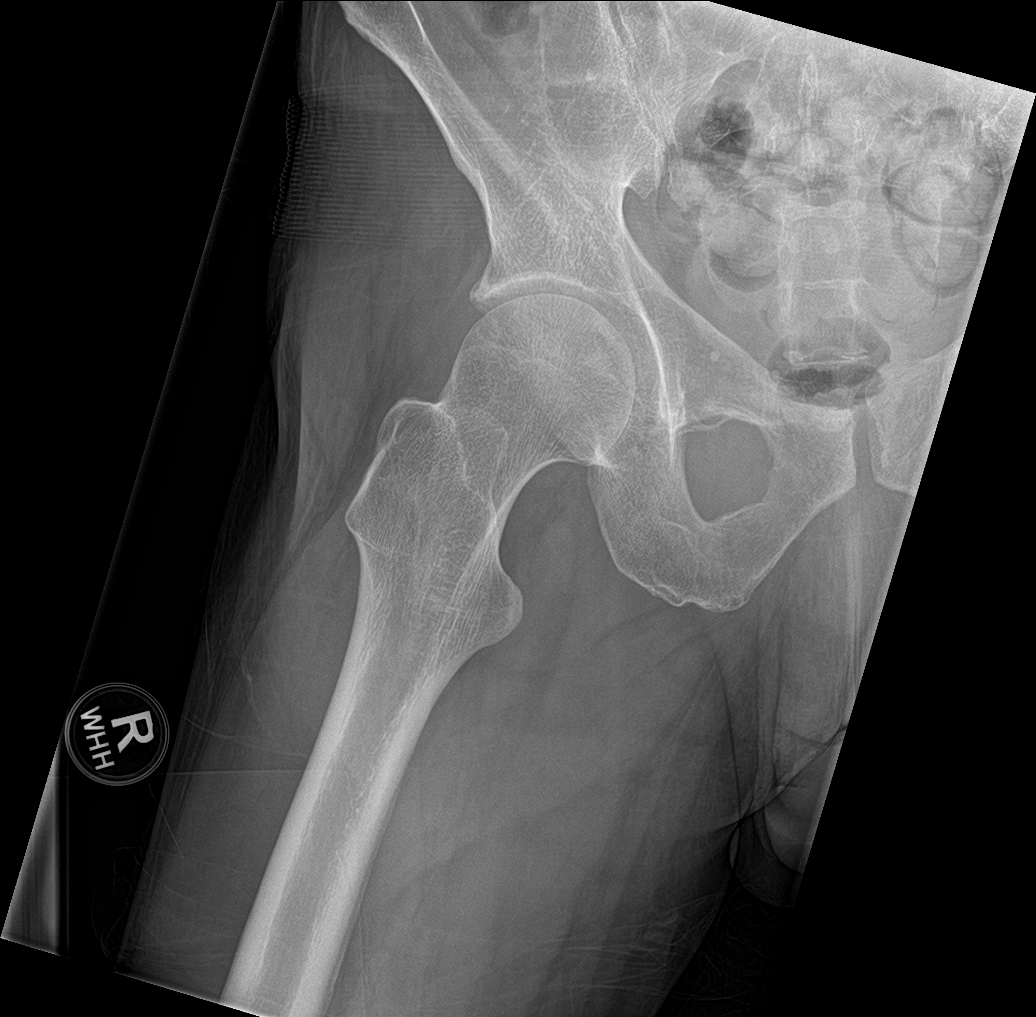

[5 of 5 positions shown; findings below may reference images not displayed]

FINDINGS: There is no evidence of hip fracture or dislocation. There is no
evidence of arthropathy or other focal bone abnormality.
IMPRESSION: Negative.

## 2023-09-09 ENCOUNTER — Emergency Department (HOSPITAL_COMMUNITY): Payer: Medicaid Other

## 2023-09-09 ENCOUNTER — Encounter (HOSPITAL_COMMUNITY): Payer: Self-pay | Admitting: *Deleted

## 2023-09-09 ENCOUNTER — Emergency Department (HOSPITAL_COMMUNITY)
Admission: EM | Admit: 2023-09-09 | Discharge: 2023-09-09 | Disposition: A | Discharge status: Home / Self Care | Payer: Medicaid Other

## 2023-09-09 ENCOUNTER — Other Ambulatory Visit: Payer: Self-pay

## 2023-09-09 DIAGNOSIS — F1490 Cocaine use, unspecified, uncomplicated: Secondary | ICD-10-CM | POA: Insufficient documentation

## 2023-09-09 DIAGNOSIS — F1590 Other stimulant use, unspecified, uncomplicated: Secondary | ICD-10-CM | POA: Insufficient documentation

## 2023-09-09 DIAGNOSIS — I10 Essential (primary) hypertension: Secondary | ICD-10-CM | POA: Diagnosis not present

## 2023-09-09 DIAGNOSIS — R1013 Epigastric pain: Secondary | ICD-10-CM | POA: Diagnosis not present

## 2023-09-09 DIAGNOSIS — F1990 Other psychoactive substance use, unspecified, uncomplicated: Secondary | ICD-10-CM

## 2023-09-09 DIAGNOSIS — R109 Unspecified abdominal pain: Secondary | ICD-10-CM | POA: Diagnosis present

## 2023-09-09 HISTORY — DX: Essential (primary) hypertension: I10

## 2023-09-09 HISTORY — DX: Diaphragmatic hernia without obstruction or gangrene: K44.9

## 2023-09-09 LAB — URINALYSIS, ROUTINE W REFLEX MICROSCOPIC
Bilirubin Urine: NEGATIVE
Glucose, UA: NEGATIVE mg/dL
Hgb urine dipstick: NEGATIVE
Ketones, ur: NEGATIVE mg/dL
Leukocytes,Ua: NEGATIVE
Nitrite: NEGATIVE
Protein, ur: NEGATIVE mg/dL
Specific Gravity, Urine: 1.021 (ref 1.005–1.030)
pH: 6 (ref 5.0–8.0)

## 2023-09-09 LAB — CBC
HCT: 42.2 % (ref 39.0–52.0)
Hemoglobin: 13.5 g/dL (ref 13.0–17.0)
MCH: 28.9 pg (ref 26.0–34.0)
MCHC: 32 g/dL (ref 30.0–36.0)
MCV: 90.4 fL (ref 80.0–100.0)
Platelets: 325 10*3/uL (ref 150–400)
RBC: 4.67 MIL/uL (ref 4.22–5.81)
RDW: 14.6 % (ref 11.5–15.5)
WBC: 7.4 10*3/uL (ref 4.0–10.5)
nRBC: 0 % (ref 0.0–0.2)

## 2023-09-09 LAB — COMPREHENSIVE METABOLIC PANEL
ALT: 22 U/L (ref 0–44)
AST: 24 U/L (ref 15–41)
Albumin: 4.5 g/dL (ref 3.5–5.0)
Alkaline Phosphatase: 54 U/L (ref 38–126)
Anion gap: 10 (ref 5–15)
BUN: 21 mg/dL — ABNORMAL HIGH (ref 6–20)
CO2: 28 mmol/L (ref 22–32)
Calcium: 9.8 mg/dL (ref 8.9–10.3)
Chloride: 100 mmol/L (ref 98–111)
Creatinine, Ser: 0.89 mg/dL (ref 0.61–1.24)
GFR, Estimated: >60 mL/min (ref >60)
Glucose, Bld: 111 mg/dL — ABNORMAL HIGH (ref 70–99)
Potassium: 4.9 mmol/L (ref 3.5–5.1)
Sodium: 138 mmol/L (ref 135–145)
Total Bilirubin: 0.4 mg/dL (ref 0.3–1.2)
Total Protein: 7.8 g/dL (ref 6.5–8.1)

## 2023-09-09 LAB — RAPID URINE DRUG SCREEN, HOSP PERFORMED
Amphetamines: POSITIVE — AB
Barbiturates: NOT DETECTED
Benzodiazepines: NOT DETECTED
Cocaine: POSITIVE — AB
Opiates: NOT DETECTED
Tetrahydrocannabinol: POSITIVE — AB

## 2023-09-09 LAB — LIPASE, BLOOD: Lipase: 23 U/L (ref 11–51)

## 2023-09-09 MED ORDER — IOHEXOL 300 MG/ML  SOLN
100.0000 mL | Freq: Once | INTRAMUSCULAR | Status: AC | PRN
  Administered 2023-09-09: 100 mL via INTRAVENOUS

## 2023-09-09 MED ORDER — FAMOTIDINE 20 MG PO TABS
20.0000 mg | ORAL_TABLET | Freq: Once | ORAL | Status: AC
  Administered 2023-09-09: 20 mg via ORAL
  Filled 2023-09-09: qty 1

## 2023-09-09 MED ORDER — FAMOTIDINE 20 MG PO TABS: 20.0000 mg | ORAL_TABLET | Freq: Two times a day (BID) | ORAL | 0 refills | Status: AC | PRN

## 2023-09-09 MED ORDER — ALUM & MAG HYDROXIDE-SIMETH 200-200-20 MG/5ML PO SUSP
15.0000 mL | Freq: Once | ORAL | Status: AC
  Administered 2023-09-09: 15 mL via ORAL
  Filled 2023-09-09: qty 30

## 2023-09-09 NOTE — ED Notes (Signed)
Patient transported to CT 

## 2023-09-09 NOTE — Discharge Instructions (Addendum)
As discussed, CT scanning appeared well.  No evidence of infection of your gallbladder and what appears to be intact.  Suspect that your symptoms could have been secondary to GERD.  Will recommend continue use of reflux medication in the outpatient setting.  See admission attached regarding dietary changes.  Recommend follow-up with primary care for reassessment of your symptoms.  Please do not hesitate to return to emergency department for worrisome signs and symptoms we discussed become apparent.

## 2023-09-09 NOTE — ED Triage Notes (Signed)
Pt states abd pain x 3 years, constant per pt.  Pt states he has a hernia. Pt drinking a coffee and soda prior to triage.

## 2023-09-09 NOTE — ED Notes (Signed)
Patient given urine cup to provide specimen; patient ambulatory to restroom.

## 2023-09-09 NOTE — ED Notes (Signed)
ED Provider at bedside. 

## 2023-09-09 NOTE — ED Provider Notes (Signed)
[facility] EMERGENCY DEPARTMENT AT [facility] Provider Note   CSN: [id number] Arrival date & time: 09/09/23  1133     History {Add pertinent medical, surgical, social history, OB history to HPI:1} Chief Complaint  Patient presents with   Abdominal Pain    Donald Haynes is a 52 y.o. male.   Abdominal Pain   52 year old male presents emergency department with complaints of abdominal pain.  Patient states he has been having abdominal pain for 3 years.  Patient states that pain has been constant since onset.  Patient states she has a history of hiatal hernia.  Reports worsening pain over the past few days or so.  Was seen in [place] yesterday and was told there may have been something wrong with his gallbladder but he left prior to being seen.  Presents emergency department for further assessment.  Denies any fever, nausea, vomit, urinary symptoms, change in bowel habits.  Patient reports last bowel movement 2 days ago and regular per patient.  Denies history of abdominal surgeries.  Past medical history significant for hiatal hernia, hypertension  Home Medications Prior to Admission medications   Medication Sig Start Date End Date Taking? Authorizing Provider  cyclobenzaprine (FLEXERIL) 10 MG tablet Take 1 tablet (10 mg total) by mouth 3 (three) times daily as needed for muscle spasms. 11/06/17   Dione Booze, MD  naproxen (NAPROSYN) 500 MG tablet Take 1 tablet (500 mg total) by mouth 2 (two) times daily. 11/06/17   Dione Booze, MD  oxyCODONE-acetaminophen (PERCOCET) 5-325 MG tablet Take 1 tablet by mouth every 4 (four) hours as needed for moderate pain. 11/06/17   Dione Booze, MD      Allergies    Alpha-gal, Penicillins, and Tramadol    Review of Systems   Review of Systems  Gastrointestinal:  Positive for abdominal pain.  All other systems reviewed and are negative.   Physical Exam Updated Vital Signs BP (!) 123/93 (BP Location: Right Arm)   Pulse  87   Temp 98.7 F (37.1 C) (Oral)   Resp 16   Ht 6' (1.829 m)   Wt 54.4 kg   SpO2 100%   BMI 16.27 kg/m  Physical Exam Vitals and nursing note reviewed.  Constitutional:      General: He is not in acute distress.    Appearance: He is well-developed.  HENT:     Head: Normocephalic and atraumatic.  Eyes:     Conjunctiva/sclera: Conjunctivae normal.  Cardiovascular:     Rate and Rhythm: Normal rate and regular rhythm.     Heart sounds: No murmur heard. Pulmonary:     Effort: Pulmonary effort is normal. No respiratory distress.     Breath sounds: Normal breath sounds.  Abdominal:     Palpations: Abdomen is soft.     Tenderness: There is abdominal tenderness in the epigastric area.  Musculoskeletal:        General: No swelling.     Cervical back: Neck supple.  Skin:    General: Skin is warm and dry.     Capillary Refill: Capillary refill takes less than 2 seconds.  Neurological:     Mental Status: He is alert.  Psychiatric:        Mood and Affect: Mood normal.     ED Results / Procedures / Treatments   Labs (all labs ordered are listed, but only abnormal results are displayed) Labs Reviewed  CBC  URINALYSIS, ROUTINE W REFLEX MICROSCOPIC  LIPASE, BLOOD  COMPREHENSIVE  METABOLIC PANEL    EKG None  Radiology No results found.  Procedures Procedures  {Document cardiac monitor, telemetry assessment procedure when appropriate:1}  Medications Ordered in ED Medications - No data to display  ED Course/ Medical Decision Making/ A&P   {   Click here for ABCD2, HEART and other calculatorsREFRESH Note before signing :1}                              Medical Decision Making Amount and/or Complexity of Data Reviewed Labs: ordered. Radiology: ordered.  Risk OTC drugs. Prescription drug management.   This patient presents to the ED for concern of abdominal pain, this involves an extensive number of treatment options, and is a complaint that carries with it a high  risk of complications and morbidity.  The differential diagnosis includes gastritis, PUD, cholecystitis, CBD pathology, SBO/LBO, diverticulitis, appendicitis, aortic dissection, AAA, other   Co morbidities that complicate the patient evaluation  See HPI   Additional history obtained:  Additional history obtained from EMR External records from outside source obtained and reviewed including hospital records   Lab Tests:  I Ordered, and personally interpreted labs.  The pertinent results include:  No leukocytosis.  No evidence of anemia.  Platelets within range.  No electrolyte abnormalities.  No renal dysfunction.  No transaminitis.  UDS positive for cocaine, amphetamine, THC.  Lipase within the limits.  UA with within normal limits.   Imaging Studies ordered:  I ordered imaging studies including CT abdomen pelvis I independently visualized and interpreted imaging which showed no acute abnormalities I agree with the radiologist interpretation   Cardiac Monitoring: / EKG:  The patient was maintained on a cardiac monitor.  I personally viewed and interpreted the cardiac monitored which showed an underlying rhythm of: Sinus rhythm   Consultations Obtained:  N/a   Problem List / ED Course / Critical interventions / Medication management  Epigastric abdominal pain I ordered medication including Pepcid, Maalox   Reevaluation of the patient after these medicines showed that the patient improved I have reviewed the patients home medicines and have made adjustments as needed   Social Determinants of Health:  Polysubstance abuse   Test / Admission - Considered:  Epigastric abdominal pain Vitals signs within normal range and stable throughout visit. Laboratory/imaging studies significant for: See above *** Worrisome signs and symptoms were discussed with the patient, and the patient acknowledged understanding to return to the ED if noticed. Patient was stable upon discharge.     {Document critical care time when appropriate:1} {Document review of labs and clinical decision tools ie heart score, Chads2Vasc2 etc:1}  {Document your independent review of radiology images, and any outside records:1} {Document your discussion with family members, caretakers, and with consultants:1} {Document social determinants of health affecting pt's care:1} {Document your decision making why or why not admission, treatments were needed:1} Final Clinical Impression(s) / ED Diagnoses Final diagnoses:  None    Rx / DC Orders ED Discharge Orders     None

## 2023-09-09 NOTE — ED Notes (Signed)
Patient ambulated to restroom with no assistance.
